# Patient Record
Sex: Female | Born: 1960 | Race: Black or African American | Hispanic: No | Smoking: Never smoker
Health system: Southern US, Community
[De-identification: ages and names within clinical notes are randomized; demographics above are authoritative.]

## PROBLEM LIST (undated history)

## (undated) DIAGNOSIS — I10 Essential (primary) hypertension: Secondary | ICD-10-CM

## (undated) DIAGNOSIS — E079 Disorder of thyroid, unspecified: Secondary | ICD-10-CM

## (undated) DIAGNOSIS — I509 Heart failure, unspecified: Secondary | ICD-10-CM

---

## 2011-05-12 DIAGNOSIS — E039 Hypothyroidism, unspecified: Secondary | ICD-10-CM | POA: Insufficient documentation

## 2014-07-12 ENCOUNTER — Emergency Department (HOSPITAL_COMMUNITY)
Admission: EM | Admit: 2014-07-12 | Discharge: 2014-07-12 | Disposition: A | Discharge status: Home / Self Care | Payer: BC Managed Care – PPO | Attending: Emergency Medicine | Admitting: Emergency Medicine

## 2014-07-12 ENCOUNTER — Encounter (HOSPITAL_COMMUNITY): Payer: Self-pay | Admitting: *Deleted

## 2014-07-12 DIAGNOSIS — R51 Headache: Secondary | ICD-10-CM

## 2014-07-12 DIAGNOSIS — J029 Acute pharyngitis, unspecified: Secondary | ICD-10-CM | POA: Diagnosis present

## 2014-07-12 DIAGNOSIS — Z8639 Personal history of other endocrine, nutritional and metabolic disease: Secondary | ICD-10-CM | POA: Insufficient documentation

## 2014-07-12 DIAGNOSIS — I10 Essential (primary) hypertension: Secondary | ICD-10-CM | POA: Insufficient documentation

## 2014-07-12 DIAGNOSIS — R519 Headache, unspecified: Secondary | ICD-10-CM

## 2014-07-12 HISTORY — DX: Essential (primary) hypertension: I10

## 2014-07-12 HISTORY — DX: Disorder of thyroid, unspecified: E07.9

## 2014-07-12 MED ORDER — AMOXICILLIN 500 MG PO CAPS: 500.0000 mg | ORAL_CAPSULE | Freq: Three times a day (TID) | ORAL | Status: DC

## 2014-07-12 NOTE — ED Notes (Addendum)
scratchy throat started yesterday. Congestion and cough also present. Pt states her grandson recently had strep throat.

## 2014-07-12 NOTE — ED Notes (Addendum)
ED PA at bedside

## 2014-07-12 NOTE — Discharge Instructions (Signed)
Take tylenol for your fever and pain. Return as needed.

## 2014-07-12 NOTE — ED Provider Notes (Signed)
CSN: [id number]     Arrival date & time 07/12/14  0813 History   First MD Initiated Contact with Patient 07/12/14 [phone]     Chief Complaint  Patient presents with  . Sore Throat     (Consider location/radiation/quality/duration/timing/severity/associated sxs/prior Treatment) Patient is a 54 y.o. female presenting with pharyngitis. The history is provided by the patient.  Sore Throat This is a new problem. The current episode started yesterday. The problem occurs constantly. The problem has been gradually worsening. Associated symptoms include chills, congestion, a sore throat and swollen glands. Cough: occasional due to post nasal drainage. Fever: ? The symptoms are aggravated by swallowing. She has tried nothing for the symptoms.   Katherine Graves is a 54 y.o. female with hx of HTN and thyroid disease presents to the ED with a sore throat. She states that her grandchild has been staying with her and he was dx with strep this week. Patient reports post nasal drainage which causes her to spit up green drainage.  Past Medical History  Diagnosis Date  . Hypertension   . Thyroid disease    History reviewed. No pertinent past surgical history. No family history on file. History  Substance Use Topics  . Smoking status: Never Smoker   . Smokeless tobacco: Not on file  . Alcohol Use: No   OB History    No data available     Review of Systems  Constitutional: Positive for chills. Fever: ?  HENT: Positive for congestion, sinus pressure and sore throat.   Respiratory: Cough: occasional due to post nasal drainage.   All other systems negative    Allergies  Review of patient's allergies indicates no known allergies.  Home Medications   Prior to Admission medications   Medication Sig Start Date End Date Taking? Authorizing Provider  amoxicillin (AMOXIL) 500 MG capsule Take 1 capsule (500 mg total) by mouth 3 (three) times daily. 07/12/14   Linn Goetze Orlene OchM Antoinne Spadaccini, NP   BP 180/89 mmHg  Pulse 97   Temp(Src) 98.9 F (37.2 C) (Oral)  Resp 20  SpO2 100% Physical Exam  Constitutional: She is oriented to person, place, and time. She appears well-developed and well-nourished. No distress.  HENT:  Head: Normocephalic.  Right Ear: Tympanic membrane normal.  Left Ear: Tympanic membrane normal.  Nose: Rhinorrhea present. Right sinus exhibits maxillary sinus tenderness. Left sinus exhibits maxillary sinus tenderness.  Mouth/Throat: Uvula is midline and mucous membranes are normal. Posterior oropharyngeal erythema present.  Eyes: EOM are normal.  Neck: Neck supple.  Cardiovascular: Normal rate and regular rhythm.   Pulmonary/Chest: Effort normal. No respiratory distress. She has no wheezes. She has no rales.  Musculoskeletal: Normal range of motion.  Lymphadenopathy:    She has cervical adenopathy.  Neurological: She is alert and oriented to person, place, and time. No cranial nerve deficit.  Skin: Skin is warm and dry.  Psychiatric: She has a normal mood and affect. Her behavior is normal.  Nursing note and vitals reviewed.   ED Course  Procedures   MDM  54 y.o. female with sore throat and exposure to strep. Stable for d/c without tonsillar abscess. No difficulty swallowing. She will follow up with her PCP or return here as needed. Discussed with the patient and all questioned fully answered.  She will follow up with her PCP for her BP.  Final diagnoses:  Pharyngitis  Sinus headache      Katherine Graves M Vivica Dobosz, NP 07/12/14 [phone]  Gerhard Munchobert Lockwood, MD 07/12/14 [phone]

## 2015-09-16 ENCOUNTER — Encounter: Payer: Self-pay | Admitting: Orthopaedic Surgery

## 2015-09-16 ENCOUNTER — Ambulatory Visit (INDEPENDENT_AMBULATORY_CARE_PROVIDER_SITE_OTHER): Payer: BC Managed Care – PPO

## 2015-09-16 ENCOUNTER — Ambulatory Visit (INDEPENDENT_AMBULATORY_CARE_PROVIDER_SITE_OTHER): Payer: BC Managed Care – PPO | Admitting: Orthopaedic Surgery

## 2015-09-16 VITALS — BP 177/84 | HR 89 | Temp 97.5°F | Resp 16 | Ht 68.0 in | Wt 258.0 lb

## 2015-09-16 DIAGNOSIS — M25561 Pain in right knee: Secondary | ICD-10-CM | POA: Diagnosis not present

## 2015-09-16 NOTE — Patient Instructions (Signed)

## 2015-09-16 NOTE — Progress Notes (Signed)
Subjective:    Patient ID: Katherine Graves, female    DOB: 28-Jul-1960, 55 y.o.   MRN: [id number]  Knee Pain  There was no injury mechanism. The pain is present in the right knee. The quality of the pain is described as aching and burning. The pain is at a severity of 5/10. The pain is moderate. The pain has been worsening since onset. Associated symptoms include a loss of motion. Pertinent negatives include no loss of sensation, muscle weakness, numbness or tingling. The symptoms are aggravated by weight bearing. She has tried ice, elevation, acetaminophen, heat and rest for the symptoms. The treatment provided mild relief.   She cannot take NSAIDs.  She has had significant stomach problems in the past on NSAIDs and cannot take them.  She has swelling and popping of the right knee, no giving way, no locking, no redness, no trauma.  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  She substitutes now often but the knee pain recently has kept her from working or tutoring.  She has had pain for about two months.  [place] [facility] [place] [facility] and referred here.  She is limping.   Review of Systems  HENT: Negative for congestion.   Respiratory: Negative for cough and shortness of breath.   Cardiovascular: Negative for chest pain and leg swelling.  Endocrine: Positive for cold intolerance.  Musculoskeletal: Positive for joint swelling, arthralgias and gait problem.  Allergic/Immunologic: Positive for environmental allergies.  Neurological: Negative for tingling and numbness.   Past Medical History  Diagnosis Date  . Hypertension   . Thyroid disease     History reviewed. No pertinent past surgical history.  No current outpatient prescriptions on file prior to visit.   No current facility-administered medications on file prior to visit.    Social History   Social History  . Marital Status: Divorced    Spouse Name: N/A  . Number of  Children: N/A  . Years of Education: N/A   Occupational History  . Not on file.   Social History Main Topics  . Smoking status: Never Smoker   . Smokeless tobacco: Not on file  . Alcohol Use: No  . Drug Use: Not on file  . Sexual Activity: Not on file   Other Topics Concern  . Not on file   Social History Narrative    BP 177/84 mmHg  Pulse 89  Temp(Src) 97.5 F (36.4 C)  Resp 16  Ht 5\' 8"  (1.727 m)  Wt 258 lb (117.028 kg)  BMI 39.24 kg/m2     Objective:   Physical Exam  Constitutional: She is oriented to person, place, and time. She appears well-developed and well-nourished.  HENT:  Head: Normocephalic and atraumatic.  Eyes: Conjunctivae and EOM are normal. Pupils are equal, round, and reactive to light.  Neck: Normal range of motion. Neck supple.  Cardiovascular: Normal rate, regular rhythm and intact distal pulses.   Pulmonary/Chest: Effort normal.  Abdominal: Soft.  Musculoskeletal: She exhibits tenderness (Right knee is tender.  ROM 0 to 105 with medial joint line pain, effusion 1+ and crepitus.  Knee is stable.  Left knee slight crepitus full motion and no pain.).       Right knee: She exhibits decreased range of motion and effusion. She exhibits no deformity and no erythema. Tenderness found. Medial joint line tenderness noted.       Legs: Neurological: She is alert and oriented  to person, place, and time. She displays normal reflexes. No cranial nerve deficit. She exhibits normal muscle tone. Coordination normal.  Skin: Skin is warm and dry.  Psychiatric: She has a normal mood and affect. Her behavior is normal. Judgment and thought content normal.   X-rays were done of the right knee, reported separately.    Assessment & Plan:   Encounter Diagnosis  Name Primary?  . Right knee pain Yes   Since she cannot take NSAIDs, I talked to her about injection of the knee.  She agrees.  PROCEDURE NOTE:  The patient requests injections of the right knee , verbal  consent was obtained.  The right knee was prepped appropriately after time out was performed.   Sterile technique was observed and injection of 1 cc of Depo-Medrol 40 mg with several cc's of plain xylocaine. Anesthesia was provided by ethyl chloride and a 20-gauge needle was used to inject the knee area. The injection was tolerated well.  A band aid dressing was applied.  The patient was advised to apply ice later today and tomorrow to the injection sight as needed.  Return in two weeks.  Consider MRI if not improved.

## 2015-10-06 ENCOUNTER — Ambulatory Visit (INDEPENDENT_AMBULATORY_CARE_PROVIDER_SITE_OTHER): Payer: BC Managed Care – PPO | Admitting: Orthopaedic Surgery

## 2015-10-06 ENCOUNTER — Encounter: Payer: Self-pay | Admitting: Orthopaedic Surgery

## 2015-10-06 VITALS — BP 181/89 | HR 77 | Temp 98.1°F | Resp 16 | Ht 68.0 in | Wt 258.0 lb

## 2015-10-06 DIAGNOSIS — M25561 Pain in right knee: Secondary | ICD-10-CM | POA: Diagnosis not present

## 2015-10-06 NOTE — Progress Notes (Signed)
Patient ID: Katherine BossierDarlene P Graves, female   DOB: 08/30/1960, 55 y.o.   MRN: [id number]  CC:  I have pain of my right knee. I would like an injection.  The patient has chronic pain of the right knee.  There is no recent trauma.  There is no redness.  Injections in the past have helped.  The knee has no redness, has an effusion and crepitus present.  ROM of the knee is 0-110.  Impression:  Chronic knee pain right.  Return: one month  PROCEDURE NOTE:  The patient requests injections of the right knee , verbal consent was obtained.  The right knee was prepped appropriately after time out was performed.   Sterile technique was observed and injection of 1 cc of Depo-Medrol 40 mg with several cc's of plain xylocaine. Anesthesia was provided by ethyl chloride and a 20-gauge needle was used to inject the knee area. The injection was tolerated well.  A band aid dressing was applied.  The patient was advised to apply ice later today and tomorrow to the injection sight as needed.

## 2015-11-03 ENCOUNTER — Encounter: Payer: Self-pay | Admitting: Orthopaedic Surgery

## 2015-11-03 ENCOUNTER — Ambulatory Visit: Payer: BC Managed Care – PPO | Admitting: Orthopaedic Surgery

## 2015-11-19 ENCOUNTER — Encounter: Payer: Self-pay | Admitting: Orthopaedic Surgery

## 2015-11-19 ENCOUNTER — Ambulatory Visit (INDEPENDENT_AMBULATORY_CARE_PROVIDER_SITE_OTHER): Payer: BC Managed Care – PPO | Admitting: Orthopaedic Surgery

## 2015-11-19 VITALS — BP 154/84 | HR 84 | Temp 97.9°F | Ht 68.0 in | Wt 257.0 lb

## 2015-11-19 DIAGNOSIS — M25561 Pain in right knee: Secondary | ICD-10-CM | POA: Diagnosis not present

## 2015-11-19 MED ORDER — TRAMADOL HCL 50 MG PO TABS: 50.0000 mg | ORAL_TABLET | Freq: Four times a day (QID) | ORAL | Status: DC | PRN

## 2015-11-19 NOTE — Progress Notes (Signed)
CC:  I have pain of my right knee. I would like an injection.  The patient has chronic pain of the right knee.  There is no recent trauma.  There is no redness.  Injections in the past have helped.  The knee has no redness, has an effusion and crepitus present.  ROM of the knee is 0-105.  Impression:  Chronic knee pain right.  Return: one month  PROCEDURE NOTE:  The patient requests injections of the right knee , verbal consent was obtained.  The right knee was prepped appropriately after time out was performed.   Sterile technique was observed and injection of 1 cc of Depo-Medrol 40 mg with several cc's of plain xylocaine. Anesthesia was provided by ethyl chloride and a 20-gauge needle was used to inject the knee area. The injection was tolerated well.  A band aid dressing was applied.  The patient was advised to apply ice later today and tomorrow to the injection sight as needed.  Electronically Signed Darreld McleanWayne Aerik Polan, MD 6/22/20172:48 PM

## 2015-12-17 ENCOUNTER — Ambulatory Visit: Payer: BC Managed Care – PPO | Admitting: Orthopaedic Surgery

## 2015-12-23 ENCOUNTER — Ambulatory Visit: Payer: BC Managed Care – PPO | Admitting: Orthopaedic Surgery

## 2016-01-07 ENCOUNTER — Ambulatory Visit (INDEPENDENT_AMBULATORY_CARE_PROVIDER_SITE_OTHER): Payer: BC Managed Care – PPO | Admitting: Orthopaedic Surgery

## 2016-01-07 ENCOUNTER — Encounter: Payer: Self-pay | Admitting: Orthopaedic Surgery

## 2016-01-07 VITALS — BP 171/99 | HR 76 | Ht 67.75 in | Wt 256.0 lb

## 2016-01-07 DIAGNOSIS — M25561 Pain in right knee: Secondary | ICD-10-CM | POA: Diagnosis not present

## 2016-01-07 NOTE — Progress Notes (Signed)
CC:  I have pain of my right knee. I would like an injection.  The patient has chronic pain of the right knee.  There is no recent trauma.  There is no redness.  Injections in the past have helped.  The knee has no redness, has an effusion and crepitus present.  ROM of the knee is 0-105.  Impression:  Chronic knee pain right.  Return: 6 weeks   PROCEDURE NOTE:  The patient requests injections of the right knee , verbal consent was obtained.  The right knee was prepped appropriately after time out was performed.   Sterile technique was observed and injection of 1 cc of Depo-Medrol 40 mg with several cc's of plain xylocaine. Anesthesia was provided by ethyl chloride and a 20-gauge needle was used to inject the knee area. The injection was tolerated well.  A band aid dressing was applied.  The patient was advised to apply ice later today and tomorrow to the injection sight as needed.   Electronically Signed Darreld McleanWayne Khambrel Amsden, MD 8/10/20172:23 PM

## 2016-02-23 ENCOUNTER — Ambulatory Visit (INDEPENDENT_AMBULATORY_CARE_PROVIDER_SITE_OTHER): Payer: BC Managed Care – PPO | Admitting: Orthopaedic Surgery

## 2016-02-23 ENCOUNTER — Encounter: Payer: Self-pay | Admitting: Orthopaedic Surgery

## 2016-02-23 VITALS — BP 134/82 | HR 80 | Ht 66.0 in | Wt 253.4 lb

## 2016-02-23 DIAGNOSIS — M25561 Pain in right knee: Secondary | ICD-10-CM | POA: Diagnosis not present

## 2016-02-23 NOTE — Progress Notes (Signed)
CC:  I have pain of my right knee. I would like an injection.  The patient has chronic pain of the right knee.  There is no recent trauma.  There is no redness.  Injections in the past have helped.  The knee has no redness, has an effusion and crepitus present.  ROM of the right knee is 0-105.  Impression:  Chronic knee pain right  Return: 6 weeks  PROCEDURE NOTE:  The patient requests injections of the right knee , verbal consent was obtained.  The right knee was prepped appropriately after time out was performed.   Sterile technique was observed and injection of 1 cc of Depo-Medrol 40 mg with several cc's of plain xylocaine. Anesthesia was provided by ethyl chloride and a 20-gauge needle was used to inject the knee area. The injection was tolerated well.  A band aid dressing was applied.  The patient was advised to apply ice later today and tomorrow to the injection sight as needed.  Electronically Signed Darreld McleanWayne Topacio Cella, MD 9/26/201710:58 AM

## 2016-04-07 ENCOUNTER — Encounter: Payer: Self-pay | Admitting: Orthopaedic Surgery

## 2016-04-12 ENCOUNTER — Ambulatory Visit: Payer: BC Managed Care – PPO | Admitting: Orthopaedic Surgery

## 2017-09-16 ENCOUNTER — Encounter (HOSPITAL_COMMUNITY): Payer: Self-pay | Admitting: Emergency Medicine

## 2017-09-16 ENCOUNTER — Other Ambulatory Visit: Payer: Self-pay

## 2017-09-16 ENCOUNTER — Emergency Department (HOSPITAL_COMMUNITY)
Admission: EM | Admit: 2017-09-16 | Discharge: 2017-09-16 | Disposition: A | Discharge status: Home / Self Care | Payer: BC Managed Care – PPO | Attending: Emergency Medicine | Admitting: Emergency Medicine

## 2017-09-16 DIAGNOSIS — I10 Essential (primary) hypertension: Secondary | ICD-10-CM | POA: Insufficient documentation

## 2017-09-16 DIAGNOSIS — J0101 Acute recurrent maxillary sinusitis: Secondary | ICD-10-CM

## 2017-09-16 DIAGNOSIS — Z79899 Other long term (current) drug therapy: Secondary | ICD-10-CM | POA: Insufficient documentation

## 2017-09-16 DIAGNOSIS — R0981 Nasal congestion: Secondary | ICD-10-CM | POA: Diagnosis present

## 2017-09-16 MED ORDER — AZITHROMYCIN 250 MG PO TABS: 250.0000 mg | ORAL_TABLET | Freq: Every day | ORAL | 0 refills | Status: DC

## 2017-09-16 MED ORDER — AZITHROMYCIN 250 MG PO TABS
500.0000 mg | ORAL_TABLET | Freq: Once | ORAL | Status: AC
  Administered 2017-09-16: 500 mg via ORAL
  Filled 2017-09-16: qty 2

## 2017-09-16 MED ORDER — HYDROCODONE-ACETAMINOPHEN 5-325 MG PO TABS: 1.0000 | ORAL_TABLET | ORAL | 0 refills | Status: DC | PRN

## 2017-09-16 NOTE — ED Triage Notes (Signed)
Pt states she has had sinus pressure and cough that started last week.

## 2017-09-16 NOTE — ED Provider Notes (Signed)
  Physical Exam  BP (!) 173/103 (BP Location: Left Arm)   Pulse 72   Temp 97.7 F (36.5 C) (Oral)   Resp 20   Wt 114.8 kg (253 lb)   SpO2 96%   BMI 40.84 kg/m   Physical Exam  ED Course/Procedures     Procedures  MDM  Patient came back to the ER.  Vicodin prescription was not signed.  I wrote out a new prescription for her.       Benjiman CorePickering, Tjay Velazquez, MD 09/16/17 1024

## 2017-09-16 NOTE — ED Provider Notes (Signed)
[facility] EMERGENCY DEPARTMENT Provider Note   CSN: [id number] Arrival date & time: 09/16/17  [id number]     History   Chief Complaint Chief Complaint  Patient presents with  . Nasal Congestion    HPI Katherine Graves is a 57 y.o. female.  Patient presents to the ER for evaluation of sinus pain.  Patient reports that approximately 1 week ago she mowed the yard and then started having sinus congestion.  She normally has sinus problems, uses Flonase and Singulair regularly.  She also, however, has a history of recurrent sinus infections.  She has had thick nasal drainage with some nonproductive cough.  She has had progressive worsening of her sinus pressure and pain, could not sleep tonight because of the pain.  She has a history of infections with similar symptoms.  She also has hypertension, did not take her blood pressure medication tonight and has been taking over-the-counter medications with decongestant.     Past Medical History:  Diagnosis Date  . Hypertension   . Thyroid disease     There are no active problems to display for this patient.   History reviewed. No pertinent surgical history.   OB History   None      Home Medications    Prior to Admission medications   Medication Sig Start Date End Date Taking? Authorizing Provider  allopurinol (ZYLOPRIM) 100 MG tablet Take 100 mg by mouth daily.    [provider]  amLODipine-benazepril (LOTREL) 5-40 MG capsule Take 1 capsule by mouth daily.    [provider]  azithromycin (ZITHROMAX) 250 MG tablet Take 1 tablet (250 mg total) by mouth daily. Take first 2 tablets together, then 1 every day until finished. 09/16/17   Gilda CreasePollina, Benedetto Ryder J, MD  cetirizine (ZYRTEC) 10 MG tablet Take 10 mg by mouth daily.    [provider]  fluticasone (FLONASE) 50 MCG/ACT nasal spray Place into both nostrils daily.    [provider]  hydrALAZINE (APRESOLINE) 50 MG tablet Take 50 mg by mouth 3 (three)  times daily.    [provider]  HYDROcodone-acetaminophen (NORCO/VICODIN) 5-325 MG tablet Take 1-2 tablets by mouth every 4 (four) hours as needed. 09/16/17   Gilda CreasePollina, Leib Elahi J, MD  levothyroxine (SYNTHROID, LEVOTHROID) 125 MCG tablet Take 125 mcg by mouth daily before breakfast.    [provider]  traMADol (ULTRAM) 50 MG tablet Take 1 tablet (50 mg total) by mouth every 6 (six) hours as needed. 11/19/15   Darreld McleanKeeling, Wayne, MD    Family History No family history on file.  Social History Social History   Tobacco Use  . Smoking status: Never Smoker  . Smokeless tobacco: Never Used  Substance Use Topics  . Alcohol use: No  . Drug use: Not on file     Allergies   Patient has no known allergies.   Review of Systems Review of Systems  HENT: Positive for congestion, sinus pressure and sinus pain.   Respiratory: Positive for cough.   All other systems reviewed and are negative.    Physical Exam Updated Vital Signs BP (!) 173/103 (BP Location: Left Arm)   Pulse 72   Temp 97.7 F (36.5 C) (Oral)   Resp 20   Wt 114.8 kg (253 lb)   SpO2 96%   BMI 40.84 kg/m   Physical Exam  Constitutional: She is oriented to person, place, and time. She appears well-developed and well-nourished. No distress.  HENT:  Head: Normocephalic and atraumatic.  Right  Ear: Hearing normal.  Left Ear: Hearing normal.  Nose: Right sinus exhibits maxillary sinus tenderness. Left sinus exhibits maxillary sinus tenderness.  Mouth/Throat: Oropharynx is clear and moist and mucous membranes are normal.  Eyes: Pupils are equal, round, and reactive to light. Conjunctivae and EOM are normal.  Neck: Normal range of motion. Neck supple.  Cardiovascular: Regular rhythm, S1 normal and S2 normal. Exam reveals no gallop and no friction rub.  No murmur heard. Pulmonary/Chest: Effort normal and breath sounds normal. No respiratory distress. She exhibits no tenderness.  Abdominal: Soft. Normal  appearance and bowel sounds are normal. There is no hepatosplenomegaly. There is no tenderness. There is no rebound, no guarding, no tenderness at McBurney's point and negative Murphy's sign. No hernia.  Musculoskeletal: Normal range of motion.  Neurological: She is alert and oriented to person, place, and time. She has normal strength. No cranial nerve deficit or sensory deficit. Coordination normal. GCS eye subscore is 4. GCS verbal subscore is 5. GCS motor subscore is 6.  Skin: Skin is warm, dry and intact. No rash noted. No cyanosis.  Psychiatric: She has a normal mood and affect. Her speech is normal and behavior is normal. Thought content normal.  Nursing note and vitals reviewed.    ED Treatments / Results  Labs (all labs ordered are listed, but only abnormal results are displayed) Labs Reviewed - No data to display  EKG None  Radiology No results found.  Procedures Procedures (including critical care time)  Medications Ordered in ED Medications  azithromycin (ZITHROMAX) tablet 500 mg (has no administration in time range)     Initial Impression / Assessment and Plan / ED Course  I have reviewed the triage vital signs and the nursing notes.  Pertinent labs & imaging results that were available during my care of the patient were reviewed by me and considered in my medical decision making (see chart for details).     She with history of recurrent sinus infection presents with 1+ week of progressively worsening sinus pain and congestion.  Appears well, no hypoxia.  Blood pressure is elevated but this is multifactorial including having not taken her blood pressure medication last night and taking over-the-counter decongestants.  She was told to stop over-the-counter meds and will be started on Zithromax, continue Singulair and Flonase.  Final Clinical Impressions(s) / ED Diagnoses   Final diagnoses:  Acute recurrent maxillary sinusitis    ED Discharge Orders         Ordered    azithromycin (ZITHROMAX) 250 MG tablet  Daily     09/16/17 0342    HYDROcodone-acetaminophen (NORCO/VICODIN) 5-325 MG tablet  Every 4 hours PRN     09/16/17 0342       Gilda Crease, MD 09/16/17 [phone]

## 2017-09-16 NOTE — ED Notes (Signed)
Pt returned with unsigned Vicodin prescription that was discharged on previous shift. Current EDP aware and provided patient with hand written prescription for Vicodin. Printed prescription that was provided with discharge instructions was placed in shredder.

## 2019-02-26 ENCOUNTER — Telehealth: Payer: Self-pay | Admitting: Orthopaedic Surgery

## 2019-02-26 NOTE — Telephone Encounter (Signed)
Called back to patient regarding voice message; scheduled appointment as requested; aware of appointment.

## 2019-03-05 ENCOUNTER — Other Ambulatory Visit: Payer: Self-pay

## 2019-03-05 ENCOUNTER — Ambulatory Visit (INDEPENDENT_AMBULATORY_CARE_PROVIDER_SITE_OTHER): Payer: BC Managed Care – PPO | Admitting: Orthopaedic Surgery

## 2019-03-05 ENCOUNTER — Encounter: Payer: Self-pay | Admitting: Orthopaedic Surgery

## 2019-03-05 ENCOUNTER — Ambulatory Visit: Payer: BC Managed Care – PPO

## 2019-03-05 VITALS — BP 152/77 | HR 66 | Ht 68.0 in | Wt 272.6 lb

## 2019-03-05 DIAGNOSIS — Z6841 Body Mass Index (BMI) 40.0 and over, adult: Secondary | ICD-10-CM | POA: Diagnosis not present

## 2019-03-05 DIAGNOSIS — M25562 Pain in left knee: Secondary | ICD-10-CM | POA: Diagnosis not present

## 2019-03-05 NOTE — Progress Notes (Signed)
Subjective:    Patient ID: Katherine Graves, female    DOB: 03-May-1961, 58 y.o.   MRN: [id number]  HPI She developed acute knee pain about two weeks ago of the left knee. She has swelling and popping but no giving way. She has no redness, no trauma.  She is using a cane. It has not gotten better.  She had injection to the right knee over three years ago and it did well.   Review of Systems  Constitutional: Positive for activity change.  Musculoskeletal: Positive for arthralgias, gait problem and joint swelling.  All other systems reviewed and are negative.  For Review of Systems, all other systems reviewed and are negative.  The following is a summary of the past history medically, past history surgically, known current medicines, social history and family history.  This information is gathered electronically by the computer from prior information and documentation.  I review this each visit and have found including this information at this point in the chart is beneficial and informative.   Past Medical History:  Diagnosis Date  . Hypertension   . Thyroid disease     History reviewed. No pertinent surgical history.  Current Outpatient Medications on File Prior to Visit  Medication Sig Dispense Refill  . allopurinol (ZYLOPRIM) 100 MG tablet Take 100 mg by mouth daily.    Marland Kitchen amLODipine-benazepril (LOTREL) 5-40 MG capsule Take 1 capsule by mouth daily.    . hydrALAZINE (APRESOLINE) 50 MG tablet Take 50 mg by mouth 3 (three) times daily.    Marland Kitchen levothyroxine (SYNTHROID, LEVOTHROID) 125 MCG tablet Take 125 mcg by mouth daily before breakfast.    . metoprolol succinate (TOPROL-XL) 50 MG 24 hr tablet      No current facility-administered medications on file prior to visit.     Social History   Socioeconomic History  . Marital status: Divorced    Spouse name: Not on file  . Number of children: Not on file  . Years of education: Not on file  . Highest education level: Not on file   Occupational History  . Not on file  Social Needs  . Financial resource strain: Not on file  . Food insecurity    Worry: Not on file    Inability: Not on file  . Transportation needs    Medical: Not on file    Non-medical: Not on file  Tobacco Use  . Smoking status: Never Smoker  . Smokeless tobacco: Never Used  Substance and Sexual Activity  . Alcohol use: No  . Drug use: Not on file  . Sexual activity: Not on file  Lifestyle  . Physical activity    Days per week: Not on file    Minutes per session: Not on file  . Stress: Not on file  Relationships  . Social Herbalist on phone: Not on file    Gets together: Not on file    Attends religious service: Not on file    Active member of club or organization: Not on file    Attends meetings of clubs or organizations: Not on file    Relationship status: Not on file  . Intimate partner violence    Fear of current or ex partner: Not on file    Emotionally abused: Not on file    Physically abused: Not on file    Forced sexual activity: Not on file  Other Topics Concern  . Not on file  Social History Narrative  .  Not on file    History reviewed. No pertinent family history.  BP (!) 152/77   Pulse 66   Ht 5\' 8"  (1.727 m)   Wt 272 lb 9.6 oz (123.7 kg)   BMI 41.45 kg/m   Body mass index is 41.45 kg/m.     Objective:   Physical Exam Vitals signs reviewed.  Constitutional:      Appearance: She is well-developed.  HENT:     Head: Normocephalic and atraumatic.  Eyes:     Conjunctiva/sclera: Conjunctivae normal.     Pupils: Pupils are equal, round, and reactive to light.  Neck:     Musculoskeletal: Normal range of motion and neck supple.  Cardiovascular:     Rate and Rhythm: Normal rate and regular rhythm.  Pulmonary:     Effort: Pulmonary effort is normal.  Abdominal:     Palpations: Abdomen is soft.  Musculoskeletal:     Left knee: She exhibits swelling and effusion. Tenderness found. Medial joint  line tenderness noted.       Legs:  Skin:    General: Skin is warm and dry.  Neurological:     Mental Status: She is alert and oriented to person, place, and time.     Cranial Nerves: No cranial nerve deficit.     Motor: No abnormal muscle tone.     Coordination: Coordination normal.     Deep Tendon Reflexes: Reflexes are normal and symmetric. Reflexes normal.  Psychiatric:        Behavior: Behavior normal.        Thought Content: Thought content normal.        Judgment: Judgment normal.      X-rays were done of the left knee, reported separately.     Assessment & Plan:   Encounter Diagnoses  Name Primary?  . Acute pain of left knee Yes  . Body mass index 40.0-44.9, adult (HCC)   . Morbid obesity (HCC)    PROCEDURE NOTE:  The patient requests injections of the left knee , verbal consent was obtained.  The left knee was prepped appropriately after time out was performed.   Sterile technique was observed and injection of 1 cc of Depo-Medrol 40 mg with several cc's of plain xylocaine. Anesthesia was provided by ethyl chloride and a 20-gauge needle was used to inject the knee area. The injection was tolerated well.  A band aid dressing was applied.  The patient was advised to apply ice later today and tomorrow to the injection sight as needed.  Return in two weeks.  Call if any problem.  Precautions discussed.   Electronically Signed 03-12-1982, MD 10/6/202010:59 AM

## 2019-03-05 NOTE — Patient Instructions (Signed)
Note for out of work today

## 2019-03-19 ENCOUNTER — Ambulatory Visit: Payer: BC Managed Care – PPO | Admitting: Orthopaedic Surgery

## 2019-03-19 ENCOUNTER — Encounter: Payer: Self-pay | Admitting: Orthopaedic Surgery

## 2019-03-19 ENCOUNTER — Other Ambulatory Visit: Payer: Self-pay

## 2019-03-19 VITALS — BP 168/79 | HR 65 | Ht 68.0 in | Wt 272.0 lb

## 2019-03-19 DIAGNOSIS — M25562 Pain in left knee: Secondary | ICD-10-CM

## 2019-03-19 DIAGNOSIS — G8929 Other chronic pain: Secondary | ICD-10-CM | POA: Diagnosis not present

## 2019-03-19 MED ORDER — NAPROXEN 500 MG PO TABS: 500.0000 mg | ORAL_TABLET | Freq: Two times a day (BID) | ORAL | 5 refills | Status: DC

## 2019-03-19 NOTE — Progress Notes (Signed)
PROCEDURE NOTE:  The patient requests injections of the left knee , verbal consent was obtained.  The left knee was prepped appropriately after time out was performed.   Sterile technique was observed and injection of 1 cc of Depo-Medrol 40 mg with several cc's of plain xylocaine. Anesthesia was provided by ethyl chloride and a 20-gauge needle was used to inject the knee area. The injection was tolerated well.  A band aid dressing was applied.  The patient was advised to apply ice later today and tomorrow to the injection sight as needed.  Return in one month.  If worse, consider MRI.  Electronically Signed Sanjuana Kava, MD 10/20/202010:34 AM

## 2019-04-18 ENCOUNTER — Ambulatory Visit: Payer: BC Managed Care – PPO | Admitting: Orthopaedic Surgery

## 2019-04-18 ENCOUNTER — Encounter: Payer: Self-pay | Admitting: Orthopaedic Surgery

## 2021-04-16 ENCOUNTER — Inpatient Hospital Stay (HOSPITAL_COMMUNITY)
Admission: EM | Admit: 2021-04-16 | Discharge: 2021-04-21 | DRG: 207 | Disposition: A | Discharge status: Home / Self Care | Payer: BC Managed Care – PPO | Attending: Family Medicine | Admitting: Family Medicine

## 2021-04-16 ENCOUNTER — Emergency Department (HOSPITAL_COMMUNITY): Payer: BC Managed Care – PPO

## 2021-04-16 ENCOUNTER — Encounter (HOSPITAL_COMMUNITY): Payer: Self-pay | Admitting: Emergency Medicine

## 2021-04-16 ENCOUNTER — Other Ambulatory Visit: Payer: Self-pay

## 2021-04-16 DIAGNOSIS — J9621 Acute and chronic respiratory failure with hypoxia: Secondary | ICD-10-CM | POA: Diagnosis present

## 2021-04-16 DIAGNOSIS — I248 Other forms of acute ischemic heart disease: Secondary | ICD-10-CM | POA: Diagnosis present

## 2021-04-16 DIAGNOSIS — J1000 Influenza due to other identified influenza virus with unspecified type of pneumonia: Principal | ICD-10-CM | POA: Diagnosis present

## 2021-04-16 DIAGNOSIS — Z79899 Other long term (current) drug therapy: Secondary | ICD-10-CM

## 2021-04-16 DIAGNOSIS — Z20822 Contact with and (suspected) exposure to covid-19: Secondary | ICD-10-CM | POA: Diagnosis present

## 2021-04-16 DIAGNOSIS — J9601 Acute respiratory failure with hypoxia: Secondary | ICD-10-CM | POA: Diagnosis present

## 2021-04-16 DIAGNOSIS — Z791 Long term (current) use of non-steroidal anti-inflammatories (NSAID): Secondary | ICD-10-CM

## 2021-04-16 DIAGNOSIS — E039 Hypothyroidism, unspecified: Secondary | ICD-10-CM | POA: Diagnosis present

## 2021-04-16 DIAGNOSIS — M109 Gout, unspecified: Secondary | ICD-10-CM | POA: Diagnosis present

## 2021-04-16 DIAGNOSIS — I509 Heart failure, unspecified: Secondary | ICD-10-CM

## 2021-04-16 DIAGNOSIS — Z79890 Hormone replacement therapy (postmenopausal): Secondary | ICD-10-CM

## 2021-04-16 DIAGNOSIS — I130 Hypertensive heart and chronic kidney disease with heart failure and stage 1 through stage 4 chronic kidney disease, or unspecified chronic kidney disease: Secondary | ICD-10-CM | POA: Diagnosis present

## 2021-04-16 DIAGNOSIS — G4733 Obstructive sleep apnea (adult) (pediatric): Secondary | ICD-10-CM | POA: Diagnosis present

## 2021-04-16 DIAGNOSIS — N179 Acute kidney failure, unspecified: Secondary | ICD-10-CM | POA: Diagnosis present

## 2021-04-16 DIAGNOSIS — E876 Hypokalemia: Secondary | ICD-10-CM | POA: Diagnosis present

## 2021-04-16 DIAGNOSIS — J101 Influenza due to other identified influenza virus with other respiratory manifestations: Secondary | ICD-10-CM

## 2021-04-16 DIAGNOSIS — I272 Pulmonary hypertension, unspecified: Secondary | ICD-10-CM | POA: Diagnosis present

## 2021-04-16 DIAGNOSIS — J9622 Acute and chronic respiratory failure with hypercapnia: Secondary | ICD-10-CM | POA: Diagnosis present

## 2021-04-16 DIAGNOSIS — N1831 Chronic kidney disease, stage 3a: Secondary | ICD-10-CM | POA: Diagnosis present

## 2021-04-16 DIAGNOSIS — Z6839 Body mass index (BMI) 39.0-39.9, adult: Secondary | ICD-10-CM

## 2021-04-16 DIAGNOSIS — J45909 Unspecified asthma, uncomplicated: Secondary | ICD-10-CM | POA: Diagnosis present

## 2021-04-16 DIAGNOSIS — J9602 Acute respiratory failure with hypercapnia: Secondary | ICD-10-CM | POA: Diagnosis present

## 2021-04-16 DIAGNOSIS — R0602 Shortness of breath: Secondary | ICD-10-CM | POA: Diagnosis not present

## 2021-04-16 DIAGNOSIS — I5033 Acute on chronic diastolic (congestive) heart failure: Secondary | ICD-10-CM | POA: Diagnosis present

## 2021-04-16 DIAGNOSIS — I10 Essential (primary) hypertension: Secondary | ICD-10-CM

## 2021-04-16 HISTORY — DX: Heart failure, unspecified: I50.9

## 2021-04-16 LAB — CBC WITH DIFFERENTIAL/PLATELET
Abs Immature Granulocytes: 0.04 10*3/uL (ref 0.00–0.07)
Basophils Absolute: 0 10*3/uL (ref 0.0–0.1)
Basophils Relative: 0 %
Eosinophils Absolute: 0 10*3/uL (ref 0.0–0.5)
Eosinophils Relative: 0 %
HCT: 35.3 % — ABNORMAL LOW (ref 36.0–46.0)
Hemoglobin: 10.3 g/dL — ABNORMAL LOW (ref 12.0–15.0)
Immature Granulocytes: 1 %
Lymphocytes Relative: 18 %
Lymphs Abs: 1.3 10*3/uL (ref 0.7–4.0)
MCH: 26.2 pg (ref 26.0–34.0)
MCHC: 29.2 g/dL — ABNORMAL LOW (ref 30.0–36.0)
MCV: 89.8 fL (ref 80.0–100.0)
Monocytes Absolute: 0.3 10*3/uL (ref 0.1–1.0)
Monocytes Relative: 4 %
Neutro Abs: 5.7 10*3/uL (ref 1.7–7.7)
Neutrophils Relative %: 77 %
Platelets: 266 10*3/uL (ref 150–400)
RBC: 3.93 MIL/uL (ref 3.87–5.11)
RDW: 17.5 % — ABNORMAL HIGH (ref 11.5–15.5)
WBC: 7.3 10*3/uL (ref 4.0–10.5)
nRBC: 3.7 % — ABNORMAL HIGH (ref 0.0–0.2)

## 2021-04-16 LAB — BLOOD GAS, ARTERIAL
Acid-base deficit: 2.8 mmol/L — ABNORMAL HIGH (ref 0.0–2.0)
Bicarbonate: 21.8 mmol/L (ref 20.0–28.0)
Drawn by: 22223
FIO2: 100
O2 Saturation: 99.5 %
Patient temperature: 37
pCO2 arterial: 43.8 mmHg (ref 32.0–48.0)
pH, Arterial: 7.326 — ABNORMAL LOW (ref 7.350–7.450)
pO2, Arterial: 292 mmHg — ABNORMAL HIGH (ref 83.0–108.0)

## 2021-04-16 LAB — BASIC METABOLIC PANEL
Anion gap: 14 (ref 5–15)
BUN: 38 mg/dL — ABNORMAL HIGH (ref 8–23)
CO2: 26 mmol/L (ref 22–32)
Calcium: 8.1 mg/dL — ABNORMAL LOW (ref 8.9–10.3)
Chloride: 98 mmol/L (ref 98–111)
Creatinine, Ser: 3.03 mg/dL — ABNORMAL HIGH (ref 0.44–1.00)
GFR, Estimated: 17 mL/min — ABNORMAL LOW (ref >60)
Glucose, Bld: 116 mg/dL — ABNORMAL HIGH (ref 70–99)
Potassium: 3.8 mmol/L (ref 3.5–5.1)
Sodium: 138 mmol/L (ref 135–145)

## 2021-04-16 LAB — LACTIC ACID, PLASMA: Lactic Acid, Venous: 2.6 mmol/L (ref 0.5–1.9)

## 2021-04-16 LAB — RESP PANEL BY RT-PCR (FLU A&B, COVID) ARPGX2
Influenza A by PCR: POSITIVE — AB
Influenza B by PCR: NEGATIVE
SARS Coronavirus 2 by RT PCR: NEGATIVE

## 2021-04-16 LAB — BRAIN NATRIURETIC PEPTIDE: B Natriuretic Peptide: 1519 pg/mL — ABNORMAL HIGH (ref 0.0–100.0)

## 2021-04-16 LAB — TROPONIN I (HIGH SENSITIVITY): Troponin I (High Sensitivity): 983 ng/L (ref <18)

## 2021-04-16 MED ORDER — OSELTAMIVIR PHOSPHATE 30 MG PO CAPS
30.0000 mg | ORAL_CAPSULE | Freq: Once | ORAL | Status: AC
  Administered 2021-04-17: 30 mg via ORAL
  Filled 2021-04-16: qty 1

## 2021-04-16 MED ORDER — METHYLPREDNISOLONE SODIUM SUCC 125 MG IJ SOLR
125.0000 mg | Freq: Once | INTRAMUSCULAR | Status: AC
  Administered 2021-04-16: 125 mg via INTRAVENOUS
  Filled 2021-04-16: qty 2

## 2021-04-16 MED ORDER — FUROSEMIDE 10 MG/ML IJ SOLN
40.0000 mg | Freq: Once | INTRAMUSCULAR | Status: AC
  Administered 2021-04-16: 40 mg via INTRAVENOUS
  Filled 2021-04-16: qty 4

## 2021-04-16 MED ORDER — ALBUTEROL SULFATE (2.5 MG/3ML) 0.083% IN NEBU
INHALATION_SOLUTION | RESPIRATORY_TRACT | Status: AC
  Filled 2021-04-16: qty 12

## 2021-04-16 MED ORDER — ALBUTEROL SULFATE HFA 108 (90 BASE) MCG/ACT IN AERS
2.0000 | INHALATION_SPRAY | RESPIRATORY_TRACT | Status: DC | PRN
  Filled 2021-04-16: qty 6.7

## 2021-04-16 MED ORDER — IPRATROPIUM BROMIDE 0.02 % IN SOLN
0.5000 mg | Freq: Once | RESPIRATORY_TRACT | Status: AC
  Administered 2021-04-16: 0.5 mg via RESPIRATORY_TRACT

## 2021-04-16 MED ORDER — MAGNESIUM SULFATE 2 GM/50ML IV SOLN
2.0000 g | Freq: Once | INTRAVENOUS | Status: AC
  Administered 2021-04-16: 2 g via INTRAVENOUS
  Filled 2021-04-16: qty 50

## 2021-04-16 MED ORDER — ASPIRIN 81 MG PO CHEW
324.0000 mg | CHEWABLE_TABLET | Freq: Once | ORAL | Status: AC
  Administered 2021-04-17: 324 mg via ORAL
  Filled 2021-04-16: qty 4

## 2021-04-16 MED ORDER — ALBUTEROL (5 MG/ML) CONTINUOUS INHALATION SOLN
10.0000 mg/h | INHALATION_SOLUTION | Freq: Once | RESPIRATORY_TRACT | Status: DC
  Filled 2021-04-16: qty 20

## 2021-04-16 MED ORDER — IPRATROPIUM BROMIDE 0.02 % IN SOLN
RESPIRATORY_TRACT | Status: AC
  Filled 2021-04-16: qty 2.5

## 2021-04-16 NOTE — ED Provider Notes (Signed)
[facility] Provider Note   CSN: [id number] Arrival date & time: 04/16/21  2208     History Chief Complaint  Patient presents with   Respiratory Distress    Katherine Graves is a 60 y.o. female.  Level 5 caveat for respiratory distress.  Patient arrives via EMS with respiratory distress and hypoxia.  She has had shortness of breath for the past several days.  EMS reports her O2 sats were in the 50% on EMS arrival and she was in distress.  She was placed on oxygen and given 2 breathing treatments.  She states has a history of asthma as well as CHF.  Uses albuterol on a as needed basis.  Does not know what medication she takes.  Her cough is nonproductive.  She denies any chest pain.  She denies any fever.  She denies any abdominal pain, leg pain or leg swelling.  She has never been admitted to the hospital for her breathing.  She denies any fevers.  Denies any pain with urination or blood in the urine. She does not know what medication she takes at home.  States only uses albuterol on an as-needed basis  The history is provided by the patient and the EMS personnel. The history is limited by the condition of the patient.      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 No pertinent surgical history.   OB History   No obstetric history on file.     History reviewed. No pertinent family history.  Social History   Tobacco Use   Smoking status: Never   Smokeless tobacco: Never  Substance Use Topics   Alcohol use: No   Drug use: Never    Home Medications Prior to Admission medications   Medication Sig Start Date End Date Taking? Authorizing Provider  allopurinol (ZYLOPRIM) 100 MG tablet Take 100 mg by mouth daily.    [provider]  amLODipine-benazepril (LOTREL) 5-40 MG  capsule Take 1 capsule by mouth daily.    [provider]  hydrALAZINE (APRESOLINE) 50 MG tablet Take 50 mg by mouth 3 (three) times daily.    [provider]  levothyroxine (SYNTHROID, LEVOTHROID) 125 MCG tablet Take 125 mcg by mouth daily before breakfast.    [provider]  metoprolol succinate (TOPROL-XL) 50 MG 24 hr tablet  02/07/19   [provider]  naproxen (NAPROSYN) 500 MG tablet Take 1 tablet (500 mg total) by mouth 2 (two) times daily with a meal. 03/19/19   Sanjuana Kava, MD    Allergies    Patient has no known allergies.  Review of Systems   Review of Systems  Constitutional:  Negative for activity change, appetite change and fever.  HENT:  Negative for congestion and rhinorrhea.   Respiratory:  Positive for cough, chest tightness and shortness of breath.   Cardiovascular:  Negative for chest pain and leg swelling.  Gastrointestinal:  Negative for abdominal pain, nausea and vomiting.  Genitourinary:  Negative for dysuria and hematuria.  Musculoskeletal:  Negative for arthralgias and myalgias.  Skin:  Negative for rash.  Neurological:  Negative for dizziness, weakness and headaches.   all other systems are negative except as noted in the HPI and PMH.   Physical Exam Updated Vital Signs BP  111/68   Pulse 80   Temp 97.9 F (36.6 C) (Oral)   Resp 15   Ht 5\' 8"  (1.727 m)   Wt 125 kg   SpO2 100%   BMI 41.90 kg/m   Physical Exam Vitals and nursing note reviewed.  Constitutional:      General: She is in acute distress.     Appearance: She is well-developed. She is ill-appearing.     Comments: Moderate respiratory distress, speaking in short phrases  HENT:     Head: Normocephalic and atraumatic.     Mouth/Throat:     Pharynx: No oropharyngeal exudate.  Eyes:     Conjunctiva/sclera: Conjunctivae normal.     Pupils: Pupils are equal, round, and reactive to light.     Comments: Proptosis bilaterally  Neck:     Comments: No  meningismus. Cardiovascular:     Rate and Rhythm: Normal rate and regular rhythm.     Heart sounds: Normal heart sounds. No murmur heard. Pulmonary:     Effort: Respiratory distress present.     Breath sounds: Wheezing present.     Comments: Diminished air exchange bilaterally with tight expiratory wheezes Abdominal:     Palpations: Abdomen is soft.     Tenderness: There is no abdominal tenderness. There is no guarding or rebound.  Musculoskeletal:        General: No tenderness. Normal range of motion.     Cervical back: Normal range of motion and neck supple.     Right lower leg: No edema.     Left lower leg: No edema.  Skin:    General: Skin is warm.  Neurological:     Mental Status: She is alert and oriented to person, place, and time.     Cranial Nerves: No cranial nerve deficit.     Motor: No abnormal muscle tone.     Coordination: Coordination normal.     Comments:  5/5 strength throughout. CN 2-12 intact.Equal grip strength.   Psychiatric:        Behavior: Behavior normal.    ED Results / Procedures / Treatments   Labs (all labs ordered are listed, but only abnormal results are displayed) Labs Reviewed  RESP PANEL BY RT-PCR (FLU A&B, COVID) ARPGX2 - Abnormal; Notable for the following components:      Result Value   Influenza A by PCR POSITIVE (*)    All other components within normal limits  BRAIN NATRIURETIC PEPTIDE - Abnormal; Notable for the following components:   B Natriuretic Peptide 1,519.0 (*)    All other components within normal limits  CBC WITH DIFFERENTIAL/PLATELET - Abnormal; Notable for the following components:   Hemoglobin 10.3 (*)    HCT 35.3 (*)    MCHC 29.2 (*)    RDW 17.5 (*)    nRBC 3.7 (*)    All other components within normal limits  BASIC METABOLIC PANEL - Abnormal; Notable for the following components:   Glucose, Bld 116 (*)    BUN 38 (*)    Creatinine, Ser 3.03 (*)    Calcium 8.1 (*)    GFR, Estimated 17 (*)    All other components  within normal limits  BLOOD GAS, ARTERIAL - Abnormal; Notable for the following components:   pH, Arterial 7.326 (*)    pO2, Arterial 292 (*)    Acid-base deficit 2.8 (*)    All other components within normal limits  TSH - Abnormal; Notable for the following components:   TSH 6.864 (*)  All other components within normal limits  LACTIC ACID, PLASMA - Abnormal; Notable for the following components:   Lactic Acid, Venous 2.6 (*)    All other components within normal limits  LACTIC ACID, PLASMA - Abnormal; Notable for the following components:   Lactic Acid, Venous 2.4 (*)    All other components within normal limits  BLOOD GAS, ARTERIAL - Abnormal; Notable for the following components:   pH, Arterial 7.288 (*)    pCO2 arterial 61.7 (*)    pO2, Arterial 76.5 (*)    Acid-Base Excess 2.6 (*)    All other components within normal limits  BLOOD GAS, ARTERIAL - Abnormal; Notable for the following components:   pH, Arterial 7.302 (*)    pCO2 arterial 56.7 (*)    pO2, Arterial 110 (*)    All other components within normal limits  TROPONIN I (HIGH SENSITIVITY) - Abnormal; Notable for the following components:   Troponin I (High Sensitivity) 983 (*)    All other components within normal limits  TROPONIN I (HIGH SENSITIVITY) - Abnormal; Notable for the following components:   Troponin I (High Sensitivity) 1,407 (*)    All other components within normal limits  CULTURE, BLOOD (ROUTINE X 2)  CULTURE, BLOOD (ROUTINE X 2)  AMMONIA  HEPARIN LEVEL (UNFRACTIONATED)    EKG EKG Interpretation  Date/Time:  Friday April 16 2021 22:28:02 EST Ventricular Rate:  79 PR Interval:  129 QRS Duration: 74 QT Interval:  413 QTC Calculation: 474 R Axis:   110 Text Interpretation: Sinus rhythm Anterior infarct, old No previous ECGs available Confirmed by Ezequiel Essex [phone]) on 04/16/2021 11:28:53 PM  Radiology CT Head Wo Contrast  Result Date: 04/17/2021 CLINICAL DATA:  Delirium EXAM: CT  HEAD WITHOUT CONTRAST TECHNIQUE: Contiguous axial images were obtained from the base of the skull through the vertex without intravenous contrast. COMPARISON:  None. FINDINGS: Brain: Normal anatomic configuration. Parenchymal volume loss is commensurate with the patient's age. Moderate periventricular white matter changes are present likely reflecting the sequela of small vessel ischemia. No abnormal intra or extra-axial mass lesion or fluid collection. No abnormal mass effect or midline shift. No evidence of acute intracranial hemorrhage or infarct. Ventricular size is normal. Cerebellum unremarkable. Vascular: No asymmetric hyperdense vasculature at the skull base. Skull: Intact Sinuses/Orbits: There is bilateral exophthalmos with hypertrophy extraocular musculature as well as the retro-orbital fat in keeping with Graves ophthalmopathy. The paranasal sinuses are clear. Other: Mastoid air cells and middle ear cavities are clear. 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 Correlation with thyroid function tests is recommended. Moderate periventricular white matter changes likely reflecting the sequela of small vessel ischemia, slightly advanced in relation of the patient's age. No acute intracranial abnormality. Electronically Signed   By: Fidela Salisbury M.D.   On: 04/17/2021 03:50   DG Chest Portable 1 View  Result Date: 04/17/2021 CLINICAL DATA:  Intubation EXAM: PORTABLE CHEST 1 VIEW COMPARISON:  04/16/2021 FINDINGS: Support Apparatus: --Endotracheal tube: Tip just below the level of the clavicular heads. --Enteric tube:Tip and sideport project over the stomach. --Vascular catheter(s):None --Other: None The heart size and mediastinal contours are within normal limits. The lungs are clear. No pleural effusion or pneumothorax. IMPRESSION: No active disease. Electronically Signed   By: Ulyses Jarred M.D.   On:  04/17/2021 02:24   DG Chest Portable 1 View  Result Date: 04/16/2021 CLINICAL DATA:  Dyspnea EXAM: PORTABLE CHEST 1 VIEW COMPARISON:  None FINDINGS: The lungs are symmetrically expanded. There are mild bilateral perihilar interstitial pulmonary infiltrates which may reflect changes of airway inflammation or trace perihilar pulmonary edema. No pneumothorax or pleural effusion. Mild cardiomegaly. No acute bone abnormality. IMPRESSION: Mild bilateral perihilar interstitial pulmonary infiltrate possibly reflecting changes of airway inflammation, as can be seen with atypical infection, or trace perihilar pulmonary edema, possibly cardiogenic in nature. Mild cardiomegaly Electronically Signed   By: Fidela Salisbury M.D.   On: 04/16/2021 23:01    Procedures Procedure Name: Intubation Date/Time: 04/17/2021 1:56 AM Performed by: Ezequiel Essex, MD Pre-anesthesia Checklist: Patient identified, Patient being monitored, Emergency Drugs available, Timeout performed and Suction available Oxygen Delivery Method: Non-rebreather mask Preoxygenation: Pre-oxygenation with 100% oxygen Induction Type: Rapid sequence Ventilation: Mask ventilation without difficulty Laryngoscope Size: Glidescope and 3 Grade View: Grade I Tube size: 7.5 mm Number of attempts: 1 Airway Equipment and Method: Video-laryngoscopy and Stylet Placement Confirmation: ETT inserted through vocal cords under direct vision, CO2 detector and Breath sounds checked- equal and bilateral Secured at: 25 cm Tube secured with: ETT holder Dental Injury: Teeth and Oropharynx as per pre-operative assessment  Difficulty Due To: Difficult Airway- due to large tongue and Difficult Airway- due to limited oral opening Future Recommendations: Recommend- induction with short-acting agent, and alternative techniques readily available    .Critical Care Performed by: Ezequiel Essex, MD Authorized by: Ezequiel Essex, MD   Critical care provider  statement:    Critical care time (minutes):  75   Critical care time was exclusive of:  Separately billable procedures and treating other patients   Critical care was necessary to treat or prevent imminent or life-threatening deterioration of the following conditions:  Cardiac failure and respiratory failure   Critical care was time spent personally by me on the following activities:  Development of treatment plan with patient or surrogate, discussions with consultants, evaluation of patient's response to treatment, examination of patient, ordering and review of laboratory studies, ordering and review of radiographic studies, ordering and performing treatments and interventions, pulse oximetry, re-evaluation of patient's condition, review of old charts and blood draw for specimens   I assumed direction of critical care for this patient from another provider in my specialty: no     Care discussed with: admitting provider     Medications Ordered in ED Medications  albuterol (VENTOLIN HFA) 108 (90 Base) MCG/ACT inhaler 2 puff (has no administration in time range)  albuterol (PROVENTIL) (2.5 MG/3ML) 0.083% nebulizer solution (  Canceled Entry 04/16/21 2312)  albuterol (PROVENTIL,VENTOLIN) solution continuous neb ( Nebulization Canceled Entry 04/16/21 2311)  magnesium sulfate IVPB 2 g 50 mL (2 g Intravenous New Bag/Given 04/16/21 2319)  furosemide (LASIX) injection 40 mg (has no administration in time range)  ipratropium (ATROVENT) 0.02 % nebulizer solution (  Given 04/16/21 2312)  ipratropium (ATROVENT) nebulizer solution 0.5 mg (0.5 mg Nebulization Given 04/16/21 2312)  methylPREDNISolone sodium succinate (SOLU-MEDROL) 125 mg/2 mL injection 125 mg (125 mg Intravenous Given 04/16/21 2320)    ED Course  I have reviewed the triage vital signs and the nursing notes.  Pertinent labs & imaging results that were available during my care of the patient were reviewed by me and considered in my medical  decision making (Graves chart for details).    MDM Rules/Calculators/A&P                          Respiratory distress for the past several days here with hypoxia.  Placed on BiPAP on arrival.  She was given bronchodilators, nebulizers, steroids and magnesium.  Also given dose of IV Lasix.  EKG without acute ischemia.  Cardiac silhouette enlarged on chest x-ray but no pericardial effusion seen on ultrasound.  ABG without significant CO2 retention.  EKG without acute ischemia.  Troponin is 983.  Patient given aspirin.  She denies any chest pain.  She is found to be flu positive.  Creatinine is 3 without comparison.  Patient increasingly somnolent on BiPAP.  She was given Narcan with no effect.  No pain medications given here or at home.  ABG shows worsening pH and CO2 retention.  Remains increasingly obtunded and decision made to intubate.  Patient's son and significant other updated.  Patient intubated without incident.  Ventilator adjusted to compensate for respiratory acidosis.  CT head is negative. Good urine output after Lasix with improvement on chest x-ray appearance Heparin initiated for suspected ACS given elevated troponin and evidence of heart failure.  EKG without acute ischemia.  Admission to [facility], ICU discussed with Dr. Carson Myrtle.   Katherine Graves was evaluated in Emergency Department on 04/17/2021 for the symptoms described in the history of present illness. She was evaluated in the context of the global COVID-19 pandemic, which necessitated consideration that the patient might be at risk for infection with the SARS-CoV-2 virus that causes COVID-19. Institutional protocols and algorithms that pertain to the evaluation of patients at risk for COVID-19 are in a state of rapid change based on information released by regulatory bodies including the CDC and federal and state organizations. These policies and algorithms were followed during the patient's care in the ED.   Final  Clinical Impression(s) / ED Diagnoses Final diagnoses:  Acute respiratory failure with hypoxia and hypercapnia (HCC)  Acute on chronic congestive heart failure, unspecified heart failure type [facility])    Rx / DC Orders ED Discharge Orders     None        Georgia Baria, Annie Main, MD 04/17/21 0532

## 2021-04-16 NOTE — ED Triage Notes (Signed)
Pt arrived via [facility]. Pt received 2 Neb Treatments PTA when Pt was found to have O2 Sats in 50's on Room Air upon EMS arrival. Pts noted to have audible wheezing.

## 2021-04-16 NOTE — ED Notes (Signed)
Date and time results received: 04/16/21 2330   Test: TROPONIN Critical Value: 983  Name of Provider Notified: Rancour, MD

## 2021-04-17 ENCOUNTER — Emergency Department (HOSPITAL_COMMUNITY): Payer: BC Managed Care – PPO

## 2021-04-17 ENCOUNTER — Inpatient Hospital Stay (HOSPITAL_COMMUNITY): Payer: BC Managed Care – PPO

## 2021-04-17 DIAGNOSIS — J9602 Acute respiratory failure with hypercapnia: Secondary | ICD-10-CM | POA: Diagnosis present

## 2021-04-17 DIAGNOSIS — E039 Hypothyroidism, unspecified: Secondary | ICD-10-CM | POA: Diagnosis present

## 2021-04-17 DIAGNOSIS — Z791 Long term (current) use of non-steroidal anti-inflammatories (NSAID): Secondary | ICD-10-CM | POA: Diagnosis not present

## 2021-04-17 DIAGNOSIS — J1100 Influenza due to unidentified influenza virus with unspecified type of pneumonia: Secondary | ICD-10-CM

## 2021-04-17 DIAGNOSIS — N1831 Chronic kidney disease, stage 3a: Secondary | ICD-10-CM | POA: Diagnosis present

## 2021-04-17 DIAGNOSIS — N179 Acute kidney failure, unspecified: Secondary | ICD-10-CM

## 2021-04-17 DIAGNOSIS — Z79890 Hormone replacement therapy (postmenopausal): Secondary | ICD-10-CM | POA: Diagnosis not present

## 2021-04-17 DIAGNOSIS — J9601 Acute respiratory failure with hypoxia: Secondary | ICD-10-CM

## 2021-04-17 DIAGNOSIS — I248 Other forms of acute ischemic heart disease: Secondary | ICD-10-CM | POA: Diagnosis present

## 2021-04-17 DIAGNOSIS — J1000 Influenza due to other identified influenza virus with unspecified type of pneumonia: Secondary | ICD-10-CM | POA: Diagnosis present

## 2021-04-17 DIAGNOSIS — Z20822 Contact with and (suspected) exposure to covid-19: Secondary | ICD-10-CM | POA: Diagnosis present

## 2021-04-17 DIAGNOSIS — E876 Hypokalemia: Secondary | ICD-10-CM

## 2021-04-17 DIAGNOSIS — I509 Heart failure, unspecified: Secondary | ICD-10-CM | POA: Diagnosis not present

## 2021-04-17 DIAGNOSIS — Z79899 Other long term (current) drug therapy: Secondary | ICD-10-CM | POA: Diagnosis not present

## 2021-04-17 DIAGNOSIS — J45909 Unspecified asthma, uncomplicated: Secondary | ICD-10-CM | POA: Diagnosis present

## 2021-04-17 DIAGNOSIS — I5033 Acute on chronic diastolic (congestive) heart failure: Secondary | ICD-10-CM | POA: Diagnosis present

## 2021-04-17 DIAGNOSIS — M109 Gout, unspecified: Secondary | ICD-10-CM | POA: Diagnosis present

## 2021-04-17 DIAGNOSIS — Z6839 Body mass index (BMI) 39.0-39.9, adult: Secondary | ICD-10-CM | POA: Diagnosis not present

## 2021-04-17 DIAGNOSIS — G4733 Obstructive sleep apnea (adult) (pediatric): Secondary | ICD-10-CM | POA: Diagnosis present

## 2021-04-17 DIAGNOSIS — I130 Hypertensive heart and chronic kidney disease with heart failure and stage 1 through stage 4 chronic kidney disease, or unspecified chronic kidney disease: Secondary | ICD-10-CM | POA: Diagnosis present

## 2021-04-17 DIAGNOSIS — I272 Pulmonary hypertension, unspecified: Secondary | ICD-10-CM | POA: Diagnosis present

## 2021-04-17 DIAGNOSIS — R0602 Shortness of breath: Secondary | ICD-10-CM | POA: Diagnosis present

## 2021-04-17 DIAGNOSIS — J9622 Acute and chronic respiratory failure with hypercapnia: Secondary | ICD-10-CM | POA: Diagnosis present

## 2021-04-17 DIAGNOSIS — J9621 Acute and chronic respiratory failure with hypoxia: Secondary | ICD-10-CM | POA: Diagnosis present

## 2021-04-17 LAB — BLOOD GAS, ARTERIAL
Acid-Base Excess: 2.6 mmol/L — ABNORMAL HIGH (ref 0.0–2.0)
Acid-Base Excess: 1.5 mmol/L (ref 0.0–2.0)
Bicarbonate: 25.7 mmol/L (ref 20.0–28.0)
Bicarbonate: 24.9 mmol/L (ref 20.0–28.0)
Drawn by: 22223
Drawn by: 22223
FIO2: 35
FIO2: 60
O2 Saturation: 90 %
O2 Saturation: 96.6 %
Patient temperature: 37
Patient temperature: 37
pCO2 arterial: 61.7 mmHg — ABNORMAL HIGH (ref 32.0–48.0)
pCO2 arterial: 56.7 mmHg — ABNORMAL HIGH (ref 32.0–48.0)
pH, Arterial: 7.288 — ABNORMAL LOW (ref 7.350–7.450)
pH, Arterial: 7.302 — ABNORMAL LOW (ref 7.350–7.450)
pO2, Arterial: 76.5 mmHg — ABNORMAL LOW (ref 83.0–108.0)
pO2, Arterial: 110 mmHg — ABNORMAL HIGH (ref 83.0–108.0)

## 2021-04-17 LAB — CBC WITH DIFFERENTIAL/PLATELET
Abs Immature Granulocytes: 0.04 10*3/uL (ref 0.00–0.07)
Basophils Absolute: 0 10*3/uL (ref 0.0–0.1)
Basophils Relative: 0 %
Eosinophils Absolute: 0 10*3/uL (ref 0.0–0.5)
Eosinophils Relative: 0 %
HCT: 37.8 % (ref 36.0–46.0)
Hemoglobin: 11.4 g/dL — ABNORMAL LOW (ref 12.0–15.0)
Immature Granulocytes: 1 %
Lymphocytes Relative: 13 %
Lymphs Abs: 0.9 10*3/uL (ref 0.7–4.0)
MCH: 25.6 pg — ABNORMAL LOW (ref 26.0–34.0)
MCHC: 30.2 g/dL (ref 30.0–36.0)
MCV: 84.8 fL (ref 80.0–100.0)
Monocytes Absolute: 0.2 10*3/uL (ref 0.1–1.0)
Monocytes Relative: 3 %
Neutro Abs: 5.9 10*3/uL (ref 1.7–7.7)
Neutrophils Relative %: 83 %
Platelets: 278 10*3/uL (ref 150–400)
RBC: 4.46 MIL/uL (ref 3.87–5.11)
RDW: 17.3 % — ABNORMAL HIGH (ref 11.5–15.5)
WBC: 7 10*3/uL (ref 4.0–10.5)
nRBC: 2.1 % — ABNORMAL HIGH (ref 0.0–0.2)

## 2021-04-17 LAB — TSH
TSH: 6.864 u[IU]/mL — ABNORMAL HIGH (ref 0.350–4.500)
TSH: 0.848 u[IU]/mL (ref 0.350–4.500)

## 2021-04-17 LAB — BRAIN NATRIURETIC PEPTIDE: B Natriuretic Peptide: 434 pg/mL — ABNORMAL HIGH (ref 0.0–100.0)

## 2021-04-17 LAB — ECHOCARDIOGRAM LIMITED
Area-P 1/2: 3.27 cm2
Height: 68 in
S' Lateral: 2.7 cm
Weight: 4409.2 oz

## 2021-04-17 LAB — TROPONIN I (HIGH SENSITIVITY)
Troponin I (High Sensitivity): 1407 ng/L (ref <18)
Troponin I (High Sensitivity): 1874 ng/L (ref <18)
Troponin I (High Sensitivity): 1879 ng/L (ref <18)
Troponin I (High Sensitivity): 1544 ng/L (ref <18)

## 2021-04-17 LAB — GLUCOSE, CAPILLARY
Glucose-Capillary: 114 mg/dL — ABNORMAL HIGH (ref 70–99)
Glucose-Capillary: 117 mg/dL — ABNORMAL HIGH (ref 70–99)
Glucose-Capillary: 124 mg/dL — ABNORMAL HIGH (ref 70–99)

## 2021-04-17 LAB — BASIC METABOLIC PANEL
Anion gap: 15 (ref 5–15)
BUN: 42 mg/dL — ABNORMAL HIGH (ref 8–23)
CO2: 26 mmol/L (ref 22–32)
Calcium: 8.4 mg/dL — ABNORMAL LOW (ref 8.9–10.3)
Chloride: 99 mmol/L (ref 98–111)
Creatinine, Ser: 2.46 mg/dL — ABNORMAL HIGH (ref 0.44–1.00)
GFR, Estimated: 22 mL/min — ABNORMAL LOW (ref >60)
Glucose, Bld: 127 mg/dL — ABNORMAL HIGH (ref 70–99)
Potassium: 3.1 mmol/L — ABNORMAL LOW (ref 3.5–5.1)
Sodium: 140 mmol/L (ref 135–145)

## 2021-04-17 LAB — HIV ANTIBODY (ROUTINE TESTING W REFLEX): HIV Screen 4th Generation wRfx: NONREACTIVE

## 2021-04-17 LAB — HEPARIN LEVEL (UNFRACTIONATED): Heparin Unfractionated: 0.65 IU/mL (ref 0.30–0.70)

## 2021-04-17 LAB — AMMONIA: Ammonia: 26 umol/L (ref 9–35)

## 2021-04-17 LAB — LACTIC ACID, PLASMA: Lactic Acid, Venous: 2.4 mmol/L (ref 0.5–1.9)

## 2021-04-17 MED ORDER — POLYETHYLENE GLYCOL 3350 17 G PO PACK: 17.0000 g | PACK | Freq: Every day | ORAL | Status: DC | PRN

## 2021-04-17 MED ORDER — POTASSIUM CHLORIDE 10 MEQ/100ML IV SOLN
10.0000 meq | INTRAVENOUS | Status: AC
  Administered 2021-04-17 (×4): 10 meq via INTRAVENOUS
  Filled 2021-04-17: qty 100

## 2021-04-17 MED ORDER — ALBUTEROL SULFATE (2.5 MG/3ML) 0.083% IN NEBU: 2.5000 mg | INHALATION_SOLUTION | RESPIRATORY_TRACT | Status: DC | PRN

## 2021-04-17 MED ORDER — FENTANYL CITRATE (PF) 100 MCG/2ML IJ SOLN
100.0000 ug | INTRAMUSCULAR | Status: DC | PRN
  Administered 2021-04-18: 100 ug via INTRAVENOUS
  Filled 2021-04-17 (×2): qty 2

## 2021-04-17 MED ORDER — SUCCINYLCHOLINE CHLORIDE 20 MG/ML IJ SOLN
INTRAMUSCULAR | Status: AC | PRN
  Administered 2021-04-17: 150 mg via INTRAVENOUS

## 2021-04-17 MED ORDER — HEPARIN BOLUS VIA INFUSION
4000.0000 [IU] | Freq: Once | INTRAVENOUS | Status: AC
  Administered 2021-04-17: 4000 [IU] via INTRAVENOUS

## 2021-04-17 MED ORDER — NALOXONE HCL 2 MG/2ML IJ SOSY
PREFILLED_SYRINGE | INTRAMUSCULAR | Status: AC
  Administered 2021-04-17: 1 mg
  Filled 2021-04-17: qty 2

## 2021-04-17 MED ORDER — HEPARIN (PORCINE) 25000 UT/250ML-% IV SOLN
1200.0000 [IU]/h | INTRAVENOUS | Status: DC
  Administered 2021-04-17 (×2): 1500 [IU]/h via INTRAVENOUS
  Filled 2021-04-17 (×2): qty 250

## 2021-04-17 MED ORDER — AMLODIPINE BESYLATE 5 MG PO TABS
5.0000 mg | ORAL_TABLET | Freq: Every day | ORAL | Status: DC
  Administered 2021-04-17 – 2021-04-21 (×5): 5 mg
  Filled 2021-04-17 (×5): qty 1

## 2021-04-17 MED ORDER — LACTATED RINGERS IV SOLN: INTRAVENOUS | Status: DC

## 2021-04-17 MED ORDER — ETOMIDATE 2 MG/ML IV SOLN
INTRAVENOUS | Status: AC
  Filled 2021-04-17: qty 20

## 2021-04-17 MED ORDER — FENTANYL CITRATE PF 50 MCG/ML IJ SOSY
100.0000 ug | PREFILLED_SYRINGE | INTRAMUSCULAR | Status: AC | PRN
  Administered 2021-04-17 (×3): 100 ug via INTRAVENOUS
  Filled 2021-04-17 (×3): qty 2

## 2021-04-17 MED ORDER — DOCUSATE SODIUM 50 MG/5ML PO LIQD
100.0000 mg | Freq: Two times a day (BID) | ORAL | Status: DC | PRN
  Filled 2021-04-17: qty 10

## 2021-04-17 MED ORDER — ETOMIDATE 2 MG/ML IV SOLN
INTRAVENOUS | Status: AC | PRN
  Administered 2021-04-17: 20 mg via INTRAVENOUS

## 2021-04-17 MED ORDER — FUROSEMIDE 10 MG/ML IJ SOLN
40.0000 mg | Freq: Once | INTRAMUSCULAR | Status: AC
  Administered 2021-04-17: 40 mg via INTRAVENOUS
  Filled 2021-04-17: qty 4

## 2021-04-17 MED ORDER — FENTANYL CITRATE PF 50 MCG/ML IJ SOSY
100.0000 ug | PREFILLED_SYRINGE | INTRAMUSCULAR | Status: DC | PRN
  Administered 2021-04-17: 100 ug via INTRAVENOUS
  Filled 2021-04-17: qty 2

## 2021-04-17 MED ORDER — CHLORHEXIDINE GLUCONATE CLOTH 2 % EX PADS
6.0000 | MEDICATED_PAD | Freq: Every day | CUTANEOUS | Status: DC
  Administered 2021-04-17 – 2021-04-19 (×3): 6 via TOPICAL

## 2021-04-17 MED ORDER — SUCCINYLCHOLINE CHLORIDE 200 MG/10ML IV SOSY
PREFILLED_SYRINGE | INTRAVENOUS | Status: AC
  Filled 2021-04-17: qty 10

## 2021-04-17 MED ORDER — NALOXONE HCL 0.4 MG/ML IJ SOLN: 1.0000 mg | Freq: Once | INTRAMUSCULAR | Status: DC

## 2021-04-17 MED ORDER — POTASSIUM CHLORIDE 20 MEQ PO PACK
40.0000 meq | PACK | Freq: Once | ORAL | Status: AC
Start: 2021-04-17 — End: 2021-04-17
  Administered 2021-04-17: 40 meq
  Filled 2021-04-17: qty 2

## 2021-04-17 MED ORDER — PROPOFOL 1000 MG/100ML IV EMUL
5.0000 ug/kg/min | INTRAVENOUS | Status: DC
Start: 2021-04-17 — End: 2021-04-18
  Administered 2021-04-17 (×2): 30 ug/kg/min via INTRAVENOUS
  Administered 2021-04-17: 5 ug/kg/min via INTRAVENOUS
  Administered 2021-04-18: 20 ug/kg/min via INTRAVENOUS
  Filled 2021-04-17 (×5): qty 100

## 2021-04-17 MED ORDER — ACETAMINOPHEN 160 MG/5ML PO SOLN
650.0000 mg | ORAL | Status: DC | PRN
  Filled 2021-04-17: qty 20.3

## 2021-04-17 MED ORDER — IPRATROPIUM-ALBUTEROL 0.5-2.5 (3) MG/3ML IN SOLN: 3.0000 mL | Freq: Four times a day (QID) | RESPIRATORY_TRACT | Status: DC | PRN

## 2021-04-17 MED ORDER — PANTOPRAZOLE SODIUM 40 MG IV SOLR
40.0000 mg | Freq: Every day | INTRAVENOUS | Status: DC
  Administered 2021-04-17: 40 mg via INTRAVENOUS
  Filled 2021-04-17: qty 40

## 2021-04-17 MED ORDER — ONDANSETRON HCL 4 MG/2ML IJ SOLN: 4.0000 mg | Freq: Four times a day (QID) | INTRAMUSCULAR | Status: DC | PRN

## 2021-04-17 MED ORDER — MIDAZOLAM HCL 2 MG/2ML IJ SOLN
2.0000 mg | INTRAMUSCULAR | Status: DC | PRN
  Administered 2021-04-17: 2 mg via INTRAVENOUS
  Filled 2021-04-17: qty 2

## 2021-04-17 MED ORDER — MIDAZOLAM HCL 2 MG/2ML IJ SOLN
2.0000 mg | INTRAMUSCULAR | Status: AC | PRN
  Administered 2021-04-17 (×3): 2 mg via INTRAVENOUS
  Filled 2021-04-17 (×3): qty 2

## 2021-04-17 MED ORDER — OSELTAMIVIR PHOSPHATE 30 MG PO CAPS
30.0000 mg | ORAL_CAPSULE | Freq: Two times a day (BID) | ORAL | Status: DC
  Administered 2021-04-17 – 2021-04-20 (×6): 30 mg
  Filled 2021-04-17 (×7): qty 1

## 2021-04-17 NOTE — Progress Notes (Signed)
ANTICOAGULATION CONSULT NOTE -   Pharmacy Consult for Heparin Indication: chest pain/ACS  No Known Allergies  Patient Measurements: Height: 5\' 8"  (172.7 cm) Weight: 125 kg (275 lb 9.2 oz) IBW/kg (Calculated) : 63.9 Heparin Dosing Weight: 95 kg  Vital Signs: BP: 158/77 (11/19 1303) Pulse Rate: 66 (11/19 1303)  Labs: Recent Labs    04/16/21 2226 04/17/21 0110 04/17/21 1011 04/17/21 1216  HGB 10.3*  --  11.4*  --   HCT 35.3*  --  37.8  --   PLT 266  --  278  --   HEPARINUNFRC  --   --   --  0.65  CREATININE 3.03*  --  2.46*  --   TROPONINIHS 983* 1,407* 1,874* 1,879*     Estimated Creatinine Clearance: 33.5 mL/min (A) (by C-G formula based on SCr of 2.46 mg/dL (H)).   Medical History: Past Medical History:  Diagnosis Date   CHF (congestive heart failure) (HCC)    Hypertension    Thyroid disease     Medications:  No current facility-administered medications on file prior to encounter.   Current Outpatient Medications on File Prior to Encounter  Medication Sig Dispense Refill   allopurinol (ZYLOPRIM) 100 MG tablet Take 100 mg by mouth daily.     amLODipine-benazepril (LOTREL) 5-40 MG capsule Take 1 capsule by mouth daily.     atorvastatin (LIPITOR) 10 MG tablet Take 10 mg by mouth daily.     hydrALAZINE (APRESOLINE) 50 MG tablet Take 50 mg by mouth 3 (three) times daily.     levothyroxine (SYNTHROID, LEVOTHROID) 125 MCG tablet Take 125 mcg by mouth daily before breakfast.     metoprolol succinate (TOPROL-XL) 100 MG 24 hr tablet Take 100 mg by mouth daily.     metoprolol succinate (TOPROL-XL) 50 MG 24 hr tablet      naproxen (NAPROSYN) 500 MG tablet Take 1 tablet (500 mg total) by mouth 2 (two) times daily with a meal. 60 tablet 5     Assessment: 60 y.o. female admitted with VDRF and elevated troponin, possible ACS, for heparin  HL 0.65-  therapeutic Trop 1407 > 1879  Goal of Therapy:  Heparin level 0.3-0.7 units/ml Monitor platelets by anticoagulation  protocol: Yes   Plan:  Continue heparin infusion at 1500 units/hr Check heparin level in 8 hours and daily. Continue to monitor H&H and platelets.  77, PharmD Clinical Pharmacist 04/17/2021 2:11 PM

## 2021-04-17 NOTE — ED Notes (Signed)
Spoke with pt son, Koleen Nimrod, and updated on pt status and that we are going to be intubating pt. Son was okay with this plan.

## 2021-04-17 NOTE — Progress Notes (Signed)
  Echocardiogram 2D Echocardiogram has been performed.  Roosvelt Maser F 04/17/2021, 3:30 PM

## 2021-04-17 NOTE — Progress Notes (Signed)
ANTICOAGULATION CONSULT NOTE - Initial Consult  Pharmacy Consult for Heparin Indication: chest pain/ACS  No Known Allergies  Patient Measurements: Height: 5\' 8"  (172.7 cm) Weight: 125 kg (275 lb 9.2 oz) IBW/kg (Calculated) : 63.9 Heparin Dosing Weight: 95 kg  Vital Signs: Temp: 97.9 F (36.6 C) (11/18 2225) Temp Source: Oral (11/18 2225) BP: 148/87 (11/19 0345) Pulse Rate: 65 (11/19 0345)  Labs: Recent Labs    04/16/21 2226 04/17/21 0110  HGB 10.3*  --   HCT 35.3*  --   PLT 266  --   CREATININE 3.03*  --   TROPONINIHS 983* 1,407*    Estimated Creatinine Clearance: 27.2 mL/min (A) (by C-G formula based on SCr of 3.03 mg/dL (H)).   Medical History: Past Medical History:  Diagnosis Date   CHF (congestive heart failure) (HCC)    Hypertension    Thyroid disease     Medications:  No current facility-administered medications on file prior to encounter.   Current Outpatient Medications on File Prior to Encounter  Medication Sig Dispense Refill   allopurinol (ZYLOPRIM) 100 MG tablet Take 100 mg by mouth daily.     amLODipine-benazepril (LOTREL) 5-40 MG capsule Take 1 capsule by mouth daily.     hydrALAZINE (APRESOLINE) 50 MG tablet Take 50 mg by mouth 3 (three) times daily.     levothyroxine (SYNTHROID, LEVOTHROID) 125 MCG tablet Take 125 mcg by mouth daily before breakfast.     metoprolol succinate (TOPROL-XL) 50 MG 24 hr tablet      naproxen (NAPROSYN) 500 MG tablet Take 1 tablet (500 mg total) by mouth 2 (two) times daily with a meal. 60 tablet 5     Assessment: 60 y.o. female admitted with VDRF and elevated troponin, possible ACS, for heparin Goal of Therapy:  Heparin level 0.3-0.7 units/ml Monitor platelets by anticoagulation protocol: Yes   Plan:  Heparin 4000 units IV bolus, then start heparin 1500 units/hr Check heparin level in 8 hours.   77 04/17/2021,4:02 AM

## 2021-04-17 NOTE — ED Notes (Signed)
Pt transported to CT and back to room with this nurse and Cala Bradford, RT with monitor and vent-pt started bucking vent on way back from CT- attempted to reach arm up and pull out ET tube.

## 2021-04-17 NOTE — H&P (Addendum)
NAME:  Katherine Graves, MRN:  [id number], DOB:  1960-09-17, LOS: 0 ADMISSION DATE:  04/16/2021, CONSULTATION DATE:  04/17/21 REFERRING MD:  Rancour - [facility], CHIEF COMPLAINT:  respiratory distress    History of Present Illness:  60 yo F PMH hypothyroidism, HTN, gout, patient reported asthma and CHF who presented to [facility]18 with SOB. SOB began several days prior to presentation with associated dry cough, no chest pain, leg pain, swelling. On EMS arrival SpO2 50%, placed on O2 and given 2x nebs. In ED, placed on BiPAP, given lasix, steroids, Bds and mag. Per ED report pt takes albuterol as needed but does not know what other home medications she takes. She was found to be Flu A positive.  Her respiratory and mental status declined despite BiPAP support. Narcan was given without effect (no opioids were administered in ED) and ABG showed worse hypercarbia. Pt intubated in ED. While in ED, trop-I also resulted elevated at 1400. ECG was without signs of acute ischemia but patient was started on heparin infusion trop-I continued to increase to 1900.   Accepted to [facility] ICU in admission    Labs on arrival  ABG 7.3/56/110 Na 140 K 3.1 Glu 127 BUN 42 Cr 2.46 Trop 1407, 1874 , 1879 LA 2.4 WBC 7 hgb 11 HCT 38 and plt 278  Pertinent  Medical History  Morbid obesity HTN Hypothyroidism Possible asthma Possible CHF  Significant Hospital Events: Including procedures, antibiotic start and stop dates in addition to other pertinent events   11/18 to ED with resp distress, hypoxia. Flu A positive. Developed hypercarbia 11/19 worse resp distress despite BiPAP. Worse hypercarbia and mentation. Started on heparin gtt from elevated trops. Intubated, transferred to [facility] ICU   Interim History / Subjective:  Arrives to [facility] ICU intubated   Objective   Blood pressure (!) 158/77, pulse 66, temperature 97.9 F (36.6 C), temperature source Oral, resp. rate (!) 24, height 5\' 8"  (1.727 m), weight 125 kg, SpO2 100 %.     Vent Mode: PRVC FiO2 (%):  [45 %-100 %] 45 % Set Rate:  [20 bmp-24 bmp] 24 bmp Vt Set:  [540 mL] 540 mL PEEP:  [5 cmH20] 5 cmH20 Plateau Pressure:  [11 cmH20-22 cmH20] 15 cmH20   Intake/Output Summary (Last 24 hours) at 04/17/2021 1413 Last data filed at 04/17/2021 1302 Gross per 24 hour  Intake 50 ml  Output 3550 ml  Net -3500 ml   Filed Weights   04/16/21 2238  Weight: 125 kg    Examination: General: chronically and critically ill appearing obese middle aged F intubated sedated NAD HENT: Exophthalmos. ETT secure. Pink mmm Lungs: Scattered crackles. No wheeze. Symmetrical chest expansion Cardiovascular: rrr s1s2 no rgm  Abdomen: obese soft ndnt  Extremities: Bilateral hyperpigmentation and chronic deformity of dorsal surface of feet. No acute joint deformity no cyanosis or clubbing  Neuro: sedated pinpoint pupils does not follow commands GU: foley, clear yellow urine  Resolved Hospital Problem list     Assessment & Plan:   Acute respiratory failure with hypoxia and hypercarbia Influenza A PNA Pulmonary edema -given steroids, lasix, Bds at [facility] ED  -don't think this is AECOPD. Possible underlying OSA/OHS. Do think possible Acute HF exacerbation, pulm edema in that setting P -repeat CXR -full MV support -PRN ABG -PRN lasix --will hold further diuresis 11/19 as pt seems to be responding well to what has been given at OSH -WUA/SBT starting tomorrow  HTN Suspected acute on chronic CHF  -reported per  pt to EDP  -unclear what antihypertensives pt actually takes at home (I see lotrel, hydral, metop) P -ECHO -holding further diuresis, will reassess in AM -will start enteral amlodipine and reassess as needed  Hypokalemia P -replace  -trend  Hypothyroidism P -hold home synthroid-- want to get appropriate med rec and see what she is actually taking -check TSH   Elevated troponin -low suspicion for ACS, probably demand P -keep hep gtt for now, recheck ECG and  trops   Best Practice (right click and "Reselect all SmartList Selections" daily)   Diet/type: tubefeeds DVT prophylaxis:  GI prophylaxis: PPI Lines: N/A Foley:  Yes, and it is still needed Code Status:  full code Last date of multidisciplinary goals of care discussion [pending]  Labs   CBC: Recent Labs  Lab 04/16/21 2226 04/17/21 1011  WBC 7.3 7.0  NEUTROABS 5.7 5.9  HGB 10.3* 11.4*  HCT 35.3* 37.8  MCV 89.8 84.8  PLT 266 278    Basic Metabolic Panel: Recent Labs  Lab 04/16/21 2226 04/17/21 1011  NA 138 140  K 3.8 3.1*  CL 98 99  CO2 26 26  GLUCOSE 116* 127*  BUN 38* 42*  CREATININE 3.03* 2.46*  CALCIUM 8.1* 8.4*   GFR: Estimated Creatinine Clearance: 33.5 mL/min (A) (by C-G formula based on SCr of 2.46 mg/dL (H)). Recent Labs  Lab 04/16/21 2226 04/17/21 0110 04/17/21 1011  WBC 7.3  --  7.0  LATICACIDVEN 2.6* 2.4*  --     Liver Function Tests: No results for input(s): AST, ALT, ALKPHOS, BILITOT, PROT, ALBUMIN in the last 168 hours. No results for input(s): LIPASE, AMYLASE in the last 168 hours. Recent Labs  Lab 04/17/21 0109  AMMONIA 26    ABG    Component Value Date/Time   PHART 7.302 (L) 04/17/2021 0238   PCO2ART 56.7 (H) 04/17/2021 0238   PO2ART 110 (H) 04/17/2021 0238   HCO3 24.9 04/17/2021 0238   ACIDBASEDEF 2.8 (H) 04/16/2021 2335   O2SAT 96.6 04/17/2021 0238     Coagulation Profile: No results for input(s): INR, PROTIME in the last 168 hours.  Cardiac Enzymes: No results for input(s): CKTOTAL, CKMB, CKMBINDEX, TROPONINI in the last 168 hours.  HbA1C: No results found for: HGBA1C  CBG: No results for input(s): GLUCAP in the last 168 hours.  Review of Systems:   Unable to obtain intubated sedated  Past Medical History:  She,  has a past medical history of CHF (congestive heart failure) (HCC), Hypertension, and Thyroid disease.   Surgical History:  History reviewed. No pertinent surgical history.   Social History:    reports that she has never smoked. She has never used smokeless tobacco. She reports that she does not drink alcohol and does not use drugs.   Family History:  Her family history is not on file.   Allergies No Known Allergies   Home Medications  Prior to Admission medications   Medication Sig Start Date End Date Taking? Authorizing Provider  allopurinol (ZYLOPRIM) 100 MG tablet Take 100 mg by mouth daily.    [provider]  amLODipine-benazepril (LOTREL) 5-40 MG capsule Take 1 capsule by mouth daily.    [provider]  atorvastatin (LIPITOR) 10 MG tablet Take 10 mg by mouth daily. 04/03/21   [provider]  hydrALAZINE (APRESOLINE) 50 MG tablet Take 50 mg by mouth 3 (three) times daily.    [provider]  levothyroxine (SYNTHROID, LEVOTHROID) 125 MCG tablet Take 125 mcg by mouth daily before  breakfast.    [provider]  metoprolol succinate (TOPROL-XL) 100 MG 24 hr tablet Take 100 mg by mouth daily. 04/09/21   [provider]  metoprolol succinate (TOPROL-XL) 50 MG 24 hr tablet  02/07/19   [provider]  naproxen (NAPROSYN) 500 MG tablet Take 1 tablet (500 mg total) by mouth 2 (two) times daily with a meal. 03/19/19   Darreld Mclean, MD     Critical care time: 45 min    CRITICAL CARE Performed by: Lanier Clam   Total critical care time: 45 minutes  Critical care time was exclusive of separately billable procedures and treating other patients.\ Critical care was necessary to treat or prevent imminent or life-threatening deterioration.  Critical care was time spent personally by me on the following activities: development of treatment plan with patient and/or surrogate as well as nursing, discussions with consultants, evaluation of patient's response to treatment, examination of patient, obtaining history from patient or surrogate, ordering and performing treatments and interventions, ordering and review of  laboratory studies, ordering and review of radiographic studies, pulse oximetry and re-evaluation of patient's condition.  Tessie Fass MSN, AGACNP-BC [facility] Pulmonary/Critical Care Medicine Amion for pager 04/17/2021, 3:10 PM

## 2021-04-17 NOTE — ED Notes (Signed)
Pt resting at this time- sedation effective

## 2021-04-17 NOTE — ED Notes (Signed)
Patient transported to CT 

## 2021-04-17 NOTE — Sedation Documentation (Signed)
Date and time results received: 04/17/21 1139  Test: Troponin Critical Value: 1874  Name of Provider Notified: Dr. Charm Barges  Orders Received? Or Actions Taken?: Orders Received - See Orders for details

## 2021-04-17 NOTE — Progress Notes (Signed)
MD notified of pt's troponins results. No new orders

## 2021-04-17 NOTE — ED Provider Notes (Signed)
Signout from Dr. Manus Gunning.  60 year old lady shortness of breath requiring intubation.  Troponins rising.  Currently on heparin.  Plan is for ICU bed at: When bed available. Physical Exam  BP (!) 158/77   Pulse 66   Temp 97.9 F (36.6 C) (Oral)   Resp (!) 24   Ht 5\' 8"  (1.727 m)   Wt 125 kg   SpO2 100%   BMI 41.90 kg/m   Physical Exam  ED Course/Procedures     Procedures  MDM  Patient became much more alert and was agitated with the endotracheal tube and so was put on some propofol for sedation.  1320 -[facility] here for transport to [facility].       , MD 04/17/21 [phone]

## 2021-04-17 NOTE — Code Documentation (Signed)
Dr Manus Gunning spoke with family by telephone,

## 2021-04-17 NOTE — Sedation Documentation (Signed)
[facility] arrived to transport patient.  1 bottle of propofol sent with transport to [facility].

## 2021-04-18 ENCOUNTER — Inpatient Hospital Stay (HOSPITAL_COMMUNITY): Payer: BC Managed Care – PPO

## 2021-04-18 DIAGNOSIS — J9602 Acute respiratory failure with hypercapnia: Secondary | ICD-10-CM | POA: Diagnosis not present

## 2021-04-18 DIAGNOSIS — N179 Acute kidney failure, unspecified: Secondary | ICD-10-CM | POA: Diagnosis not present

## 2021-04-18 DIAGNOSIS — J9601 Acute respiratory failure with hypoxia: Secondary | ICD-10-CM | POA: Diagnosis not present

## 2021-04-18 DIAGNOSIS — J1100 Influenza due to unidentified influenza virus with unspecified type of pneumonia: Secondary | ICD-10-CM | POA: Diagnosis not present

## 2021-04-18 LAB — CBC
HCT: 35.7 % — ABNORMAL LOW (ref 36.0–46.0)
Hemoglobin: 10.8 g/dL — ABNORMAL LOW (ref 12.0–15.0)
MCH: 25.5 pg — ABNORMAL LOW (ref 26.0–34.0)
MCHC: 30.3 g/dL (ref 30.0–36.0)
MCV: 84.2 fL (ref 80.0–100.0)
Platelets: 284 10*3/uL (ref 150–400)
RBC: 4.24 MIL/uL (ref 3.87–5.11)
RDW: 17.4 % — ABNORMAL HIGH (ref 11.5–15.5)
WBC: 11.7 10*3/uL — ABNORMAL HIGH (ref 4.0–10.5)
nRBC: 2.1 % — ABNORMAL HIGH (ref 0.0–0.2)

## 2021-04-18 LAB — TRIGLYCERIDES: Triglycerides: 129 mg/dL (ref <150)

## 2021-04-18 LAB — BASIC METABOLIC PANEL
Anion gap: 12 (ref 5–15)
BUN: 40 mg/dL — ABNORMAL HIGH (ref 8–23)
CO2: 27 mmol/L (ref 22–32)
Calcium: 8.5 mg/dL — ABNORMAL LOW (ref 8.9–10.3)
Chloride: 101 mmol/L (ref 98–111)
Creatinine, Ser: 2.32 mg/dL — ABNORMAL HIGH (ref 0.44–1.00)
GFR, Estimated: 23 mL/min — ABNORMAL LOW (ref >60)
Glucose, Bld: 115 mg/dL — ABNORMAL HIGH (ref 70–99)
Potassium: 3.9 mmol/L (ref 3.5–5.1)
Sodium: 140 mmol/L (ref 135–145)

## 2021-04-18 LAB — MAGNESIUM
Magnesium: 1.8 mg/dL (ref 1.7–2.4)
Magnesium: 1.9 mg/dL (ref 1.7–2.4)
Magnesium: 1.8 mg/dL (ref 1.7–2.4)

## 2021-04-18 LAB — PHOSPHORUS
Phosphorus: 4.6 mg/dL (ref 2.5–4.6)
Phosphorus: 3.3 mg/dL (ref 2.5–4.6)

## 2021-04-18 LAB — GLUCOSE, CAPILLARY
Glucose-Capillary: 90 mg/dL (ref 70–99)
Glucose-Capillary: 66 mg/dL — ABNORMAL LOW (ref 70–99)
Glucose-Capillary: 96 mg/dL (ref 70–99)
Glucose-Capillary: 71 mg/dL (ref 70–99)
Glucose-Capillary: 108 mg/dL — ABNORMAL HIGH (ref 70–99)
Glucose-Capillary: 115 mg/dL — ABNORMAL HIGH (ref 70–99)
Glucose-Capillary: 106 mg/dL — ABNORMAL HIGH (ref 70–99)

## 2021-04-18 LAB — HEPARIN LEVEL (UNFRACTIONATED): Heparin Unfractionated: 0.97 IU/mL — ABNORMAL HIGH (ref 0.30–0.70)

## 2021-04-18 MED ORDER — VITAL HIGH PROTEIN PO LIQD
1000.0000 mL | ORAL | Status: DC
  Administered 2021-04-18: 1000 mL

## 2021-04-18 MED ORDER — FUROSEMIDE 10 MG/ML IJ SOLN
40.0000 mg | Freq: Once | INTRAMUSCULAR | Status: AC
  Administered 2021-04-18: 40 mg via INTRAVENOUS
  Filled 2021-04-18: qty 4

## 2021-04-18 MED ORDER — FENTANYL CITRATE (PF) 100 MCG/2ML IJ SOLN: 50.0000 ug | Freq: Once | INTRAMUSCULAR | Status: DC

## 2021-04-18 MED ORDER — MAGNESIUM SULFATE 2 GM/50ML IV SOLN
2.0000 g | Freq: Once | INTRAVENOUS | Status: AC
  Administered 2021-04-18: 2 g via INTRAVENOUS
  Filled 2021-04-18: qty 50

## 2021-04-18 MED ORDER — LEVOTHYROXINE SODIUM 25 MCG PO TABS: 125.0000 ug | ORAL_TABLET | Freq: Every day | ORAL | Status: DC

## 2021-04-18 MED ORDER — FENTANYL BOLUS VIA INFUSION
50.0000 ug | INTRAVENOUS | Status: DC | PRN
Start: 2021-04-18 — End: 2021-04-18
  Administered 2021-04-18 (×3): 50 ug via INTRAVENOUS
  Filled 2021-04-18: qty 100

## 2021-04-18 MED ORDER — DEXTROSE 50 % IV SOLN
INTRAVENOUS | Status: AC
  Administered 2021-04-18: 25 mL
  Filled 2021-04-18: qty 50

## 2021-04-18 MED ORDER — MIDAZOLAM-SODIUM CHLORIDE 100-0.9 MG/100ML-% IV SOLN
0.5000 mg/h | INTRAVENOUS | Status: DC
  Administered 2021-04-18: 0.5 mg/h via INTRAVENOUS
  Filled 2021-04-18: qty 100

## 2021-04-18 MED ORDER — FENTANYL 2500MCG IN NS 250ML (10MCG/ML) PREMIX INFUSION
50.0000 ug/h | INTRAVENOUS | Status: DC
  Administered 2021-04-18: 50 ug/h via INTRAVENOUS
  Filled 2021-04-18: qty 250

## 2021-04-18 MED ORDER — IOHEXOL 350 MG/ML SOLN
100.0000 mL | Freq: Once | INTRAVENOUS | Status: AC | PRN
  Administered 2021-04-18: 100 mL via INTRAVENOUS

## 2021-04-18 MED ORDER — PROSOURCE TF PO LIQD
45.0000 mL | Freq: Two times a day (BID) | ORAL | Status: DC
  Administered 2021-04-18 – 2021-04-19 (×2): 45 mL
  Filled 2021-04-18 (×2): qty 45

## 2021-04-18 MED ORDER — CHLORHEXIDINE GLUCONATE 0.12% ORAL RINSE (MEDLINE KIT)
15.0000 mL | Freq: Two times a day (BID) | OROMUCOSAL | Status: DC
  Administered 2021-04-18: 15 mL via OROMUCOSAL

## 2021-04-18 MED ORDER — LEVOTHYROXINE SODIUM 25 MCG PO TABS
125.0000 ug | ORAL_TABLET | Freq: Every day | ORAL | Status: DC
  Administered 2021-04-18 – 2021-04-20 (×3): 125 ug
  Filled 2021-04-18 (×3): qty 1

## 2021-04-18 MED ORDER — ORAL CARE MOUTH RINSE
15.0000 mL | OROMUCOSAL | Status: DC
  Administered 2021-04-18 (×5): 15 mL via OROMUCOSAL

## 2021-04-18 NOTE — Procedures (Signed)
Extubation Procedure Note  Patient Details:   Name: Katherine Graves DOB: Jul 28, 1960 MRN: [id number]   Airway Documentation:  Airway 7.5 mm (Active)  Secured at (cm) 25 cm 04/17/21 0212  Measured From Lips 04/17/21 0212  Secured Location Left 04/17/21 0212  Secured By [employer] 04/17/21 0212  Prone position No 04/17/21 0212  Cuff Pressure (cm H2O) Clear OR 27-39 CmH2O 04/17/21 0212  Site Condition Dry 04/17/21 0212   Vent end date: 04/18/21 Vent end time: 1200   Evaluation  O2 sats: stable throughout Complications: No apparent complications Patient did tolerate procedure well. Bilateral Breath Sounds: Rhonchi, Diminished  Patient was extubated to a 4L [place]. No stridor was noted, cuff leak was heard. Patient tolerated well. RN and patients son at bedside during extubation.   Yes  Darolyn Rua 04/18/2021, 12:08 PM

## 2021-04-18 NOTE — Progress Notes (Addendum)
Pt extubated to 4L [place]. All vitals wnl, pt talking.

## 2021-04-18 NOTE — Progress Notes (Signed)
NAME:  Katherine Graves, MRN:  [id number], DOB:  05-Jul-1960, LOS: 1 ADMISSION DATE:  04/16/2021, CONSULTATION DATE:  04/17/21 REFERRING MD:  Rancour - [facility] ED, CHIEF COMPLAINT:  respiratory distress    History of Present Illness:  60 yo F PMH hypothyroidism, HTN, gout, patient reported asthma and CHF who presented to [facility] 11/18 with SOB. SOB began several days prior to presentation with associated dry cough, no chest pain, leg pain, swelling. On EMS arrival SpO2 50%, placed on O2 and given 2x nebs. In ED, placed on BiPAP, given lasix, steroids, Bds and mag. Per ED report pt takes albuterol as needed but does not know what other home medications she takes. She was found to be Flu A positive.  Her respiratory and mental status declined despite BiPAP support. Narcan was given without effect (no opioids were administered in ED) and ABG showed worse hypercarbia. Pt intubated in ED. While in ED, trop-I also resulted elevated at 1400. ECG was without signs of acute ischemia but patient was started on heparin infusion trop-I continued to increase to 1900.   Accepted to [facility] ICU in admission    Labs on arrival  ABG 7.3/56/110 Na 140 K 3.1 Glu 127 BUN 42 Cr 2.46 Trop 1407, 1874 , 1879 LA 2.4 WBC 7 hgb 11 HCT 38 and plt 278  Pertinent  Medical History  Morbid obesity HTN Hypothyroidism Possible asthma Possible CHF  Significant Hospital Events: Including procedures, antibiotic start and stop dates in addition to other pertinent events   11/18 to ED with resp distress, hypoxia. Flu A positive. Developed hypercarbia 11/19 worse resp distress despite BiPAP. Worse hypercarbia and mentation. Started on heparin gtt from elevated trops. Intubated, transferred to [facility] ICU  11/20 wua sbt    Interim History / Subjective:   WUA/SBT this morning with good volumes just sleepy    Objective   Blood pressure (!) 152/79, pulse (!) 52, temperature 97.9 F (36.6 C), temperature source Oral, resp. rate 18, height  5\' 8"  (1.727 m), weight 119.5 kg, SpO2 94 %.    Vent Mode: PRVC FiO2 (%):  [40 %-45 %] 40 % Set Rate:  [18 bmp-24 bmp] 18 bmp Vt Set:  [500 mL-540 mL] 500 mL PEEP:  [5 cmH20] 5 cmH20 Plateau Pressure:  [15 cmH20-23 cmH20] 19 cmH20   Intake/Output Summary (Last 24 hours) at 04/18/2021 0948 Last data filed at 04/18/2021 0900 Gross per 24 hour  Intake 1504.55 ml  Output 2350 ml  Net -845.45 ml   Filed Weights   04/16/21 2238 04/18/21 0500  Weight: 125 kg 119.5 kg    Examination: General: Chronically ill obese middle aged F intubated NAD  HENT: Exophthalmos. ETT secure anicteric sclera  Lungs: Diminished bibasilar sounds. Symmetrical chest expansion. Even unlabored on PSV  Cardiovascular: regular s1s2 no rgm  Abdomen: obese soft round ndnt  Extremities: hyperpigmentation and chronic deformity dorsal surface of feet bilaterally. No acute deformity. No cyanosis  Neuro: Drowsy Awakens to voice following commands  GU: foley clear yellow urine   Resolved Hospital Problem list     Assessment & Plan:   Acute respiratory failure with hypoxia and hypercarbia  Influenza A PNA Pulmonary Edema -given steroids, lasix, Bds at [facility] ED  -don't think this is AECOPD. Possible underlying OSA/OHS.  P -WUA/SBT -hopefully extubate 11/20  -no tamiflu -- timing wise this wouldn't likely be helpful  -Lasix   HTN  Diastolic HF  Elevated troponin, suspect demand P -dc hep gtt -amlodipine  -diurese  as above   Hypothyroidism P -synthroid   Hypomagnesemia Hypokalemia P -replace cont to trend   Best Practice (right click and "Reselect all SmartList Selections" daily)   Diet/type: tubefeeds DVT prophylaxis:  GI prophylaxis: PPI Lines: N/A Foley:  Yes, and it is still needed Code Status:  full code Last date of multidisciplinary goals of care discussion [pending]  Labs   CBC: Recent Labs  Lab 04/16/21 2226 04/17/21 1011 04/18/21 0149  WBC 7.3 7.0 11.7*  NEUTROABS 5.7 5.9   --   HGB 10.3* 11.4* 10.8*  HCT 35.3* 37.8 35.7*  MCV 89.8 84.8 84.2  PLT 266 278 284    Basic Metabolic Panel: Recent Labs  Lab 04/16/21 2226 04/17/21 1011 04/18/21 0149 04/18/21 0725  NA 138 140 140  --   K 3.8 3.1* 3.9  --   CL 98 99 101  --   CO2 26 26 27   --   GLUCOSE 116* 127* 115*  --   BUN 38* 42* 40*  --   CREATININE 3.03* 2.46* 2.32*  --   CALCIUM 8.1* 8.4* 8.5*  --   MG  --   --  1.8 1.9  PHOS  --   --   --  4.6   GFR: Estimated Creatinine Clearance: 34.6 mL/min (A) (by C-G formula based on SCr of 2.32 mg/dL (H)). Recent Labs  Lab 04/16/21 2226 04/17/21 0110 04/17/21 1011 04/18/21 0149  WBC 7.3  --  7.0 11.7*  LATICACIDVEN 2.6* 2.4*  --   --     Liver Function Tests: No results for input(s): AST, ALT, ALKPHOS, BILITOT, PROT, ALBUMIN in the last 168 hours. No results for input(s): LIPASE, AMYLASE in the last 168 hours. Recent Labs  Lab 04/17/21 0109  AMMONIA 26    ABG    Component Value Date/Time   PHART 7.302 (L) 04/17/2021 0238   PCO2ART 56.7 (H) 04/17/2021 0238   PO2ART 110 (H) 04/17/2021 0238   HCO3 24.9 04/17/2021 0238   ACIDBASEDEF 2.8 (H) 04/16/2021 2335   O2SAT 96.6 04/17/2021 0238     Coagulation Profile: No results for input(s): INR, PROTIME in the last 168 hours.  Cardiac Enzymes: No results for input(s): CKTOTAL, CKMB, CKMBINDEX, TROPONINI in the last 168 hours.  HbA1C: No results found for: HGBA1C  CBG: Recent Labs  Lab 04/17/21 2023 04/17/21 2340 04/18/21 0428 04/18/21 0750 04/18/21 0847  GLUCAP 124* 114* 90 66* 96     CRITICAL CARE Performed by: 04/20/21   Total critical care time: 38 minutes  Critical care time was exclusive of separately billable procedures and treating other patients.  Critical care was necessary to treat or prevent imminent or life-threatening deterioration.  Critical care was time spent personally by me on the following activities: development of treatment plan with patient  and/or surrogate as well as nursing, discussions with consultants, evaluation of patient's response to treatment, examination of patient, obtaining history from patient or surrogate, ordering and performing treatments and interventions, ordering and review of laboratory studies, ordering and review of radiographic studies, pulse oximetry and re-evaluation of patient's condition.  Lanier Clam MSN, AGACNP-BC [facility] Pulmonary/Critical Care Medicine Amion for pager  04/18/2021, 9:48 AM

## 2021-04-18 NOTE — Progress Notes (Signed)
ANTICOAGULATION CONSULT NOTE   Pharmacy Consult for Heparin Indication: chest pain/ACS  No Known Allergies  Patient Measurements: Height: 5\' 8"  (172.7 cm) Weight: 125 kg (275 lb 9.2 oz) IBW/kg (Calculated) : 63.9 Heparin Dosing Weight: 95 kg  Vital Signs: BP: 152/99 (11/20 0200) Pulse Rate: 67 (11/20 0200)  Labs: Recent Labs    04/16/21 2226 04/17/21 0110 04/17/21 1011 04/17/21 1216 04/17/21 1453 04/17/21 2315 04/18/21 0149  HGB 10.3*  --  11.4*  --   --   --  10.8*  HCT 35.3*  --  37.8  --   --   --  35.7*  PLT 266  --  278  --   --   --  284  HEPARINUNFRC  --   --   --  0.65  --  0.97*  --   CREATININE 3.03*  --  2.46*  --   --   --  2.32*  TROPONINIHS 983*   < > 1,874* 1,879* 1,544*  --   --    < > = values in this interval not displayed.     Estimated Creatinine Clearance: 35.5 mL/min (A) (by C-G formula based on SCr of 2.32 mg/dL (H)).   Medical History: Past Medical History:  Diagnosis Date   CHF (congestive heart failure) (HCC)    Hypertension    Thyroid disease     Medications:  No current facility-administered medications on file prior to encounter.   Current Outpatient Medications on File Prior to Encounter  Medication Sig Dispense Refill   allopurinol (ZYLOPRIM) 100 MG tablet Take 100 mg by mouth daily.     amLODipine-benazepril (LOTREL) 5-40 MG capsule Take 1 capsule by mouth daily.     atorvastatin (LIPITOR) 10 MG tablet Take 10 mg by mouth daily.     hydrALAZINE (APRESOLINE) 50 MG tablet Take 50 mg by mouth 3 (three) times daily.     levothyroxine (SYNTHROID, LEVOTHROID) 125 MCG tablet Take 125 mcg by mouth daily before breakfast.     metoprolol succinate (TOPROL-XL) 100 MG 24 hr tablet Take 100 mg by mouth daily.     naproxen (NAPROSYN) 500 MG tablet Take 1 tablet (500 mg total) by mouth 2 (two) times daily with a meal. 60 tablet 5     Assessment: 60 y.o. female admitted with VDRF and elevated troponin, possible ACS, for heparin  11/20  AM update:  Heparin level elevated  Goal of Therapy:  Heparin level 0.3-0.7 units/ml Monitor platelets by anticoagulation protocol: Yes   Plan:  Dec heparin to 1200 units/hr 1200 heparin level  12/20, PharmD, BCPS Clinical Pharmacist Phone: [phone]

## 2021-04-18 NOTE — Progress Notes (Signed)
Changed sedation due to severe bradycardia.

## 2021-04-18 NOTE — Progress Notes (Signed)
Brief Nutrition Note  Consult received for enteral/tube feeding initiation and management.  Adult Enteral Nutrition Protocol initiated. Full assessment to follow.  Admitting Dx: Acute respiratory failure with hypoxia (HCC) [J96.01] Acute respiratory failure with hypoxia and hypercapnia (HCC) [J96.01, J96.02] Acute on chronic congestive heart failure, unspecified heart failure type (HCC) [I50.9]  Body mass index is 40.06 kg/m. Pt meets criteria for morbid obesity based on current BMI.  Labs:  Recent Labs  Lab 04/16/21 2226 04/17/21 1011 04/18/21 0149  NA 138 140 140  K 3.8 3.1* 3.9  CL 98 99 101  CO2 26 26 27   BUN 38* 42* 40*  CREATININE 3.03* 2.46* 2.32*  CALCIUM 8.1* 8.4* 8.5*  MG  --   --  1.8  GLUCOSE 116* 127* 115*     Fernandez Kenley A., MS, RD, LDN (she/her/hers) RD pager number and weekend/on-call pager number located in Amion.

## 2021-04-19 DIAGNOSIS — J9601 Acute respiratory failure with hypoxia: Secondary | ICD-10-CM | POA: Diagnosis not present

## 2021-04-19 LAB — BASIC METABOLIC PANEL
Anion gap: 12 (ref 5–15)
BUN: 38 mg/dL — ABNORMAL HIGH (ref 8–23)
CO2: 30 mmol/L (ref 22–32)
Calcium: 8.3 mg/dL — ABNORMAL LOW (ref 8.9–10.3)
Chloride: 100 mmol/L (ref 98–111)
Creatinine, Ser: 1.86 mg/dL — ABNORMAL HIGH (ref 0.44–1.00)
GFR, Estimated: 30 mL/min — ABNORMAL LOW (ref >60)
Glucose, Bld: 91 mg/dL (ref 70–99)
Potassium: 3.7 mmol/L (ref 3.5–5.1)
Sodium: 142 mmol/L (ref 135–145)

## 2021-04-19 LAB — MAGNESIUM: Magnesium: 1.7 mg/dL (ref 1.7–2.4)

## 2021-04-19 LAB — CBC
HCT: 40.9 % (ref 36.0–46.0)
Hemoglobin: 12 g/dL (ref 12.0–15.0)
MCH: 25.3 pg — ABNORMAL LOW (ref 26.0–34.0)
MCHC: 29.3 g/dL — ABNORMAL LOW (ref 30.0–36.0)
MCV: 86.1 fL (ref 80.0–100.0)
Platelets: 260 10*3/uL (ref 150–400)
RBC: 4.75 MIL/uL (ref 3.87–5.11)
RDW: 17.4 % — ABNORMAL HIGH (ref 11.5–15.5)
WBC: 9 10*3/uL (ref 4.0–10.5)
nRBC: 2.4 % — ABNORMAL HIGH (ref 0.0–0.2)

## 2021-04-19 LAB — GLUCOSE, CAPILLARY
Glucose-Capillary: 102 mg/dL — ABNORMAL HIGH (ref 70–99)
Glucose-Capillary: 84 mg/dL (ref 70–99)
Glucose-Capillary: 115 mg/dL — ABNORMAL HIGH (ref 70–99)
Glucose-Capillary: 104 mg/dL — ABNORMAL HIGH (ref 70–99)

## 2021-04-19 LAB — PHOSPHORUS: Phosphorus: 4.2 mg/dL (ref 2.5–4.6)

## 2021-04-19 MED ORDER — POTASSIUM CHLORIDE CRYS ER 20 MEQ PO TBCR
40.0000 meq | EXTENDED_RELEASE_TABLET | Freq: Once | ORAL | Status: AC
  Administered 2021-04-19: 40 meq via ORAL
  Filled 2021-04-19: qty 2

## 2021-04-19 MED ORDER — MAGNESIUM SULFATE 2 GM/50ML IV SOLN
2.0000 g | Freq: Once | INTRAVENOUS | Status: AC
  Administered 2021-04-19: 2 g via INTRAVENOUS
  Filled 2021-04-19: qty 50

## 2021-04-19 MED ORDER — DOCUSATE SODIUM 100 MG PO CAPS
100.0000 mg | ORAL_CAPSULE | Freq: Two times a day (BID) | ORAL | Status: DC
  Administered 2021-04-19 – 2021-04-21 (×5): 100 mg via ORAL
  Filled 2021-04-19 (×5): qty 1

## 2021-04-19 MED ORDER — POLYETHYLENE GLYCOL 3350 17 G PO PACK
17.0000 g | PACK | Freq: Every day | ORAL | Status: DC
Start: 2021-04-19 — End: 2021-04-21
  Administered 2021-04-19: 17 g via ORAL
  Filled 2021-04-19 (×3): qty 1

## 2021-04-19 MED ORDER — HEPARIN SODIUM (PORCINE) 5000 UNIT/ML IJ SOLN
5000.0000 [IU] | Freq: Three times a day (TID) | INTRAMUSCULAR | Status: DC
  Administered 2021-04-19 – 2021-04-21 (×6): 5000 [IU] via SUBCUTANEOUS
  Filled 2021-04-19 (×6): qty 1

## 2021-04-19 MED ORDER — CARVEDILOL 3.125 MG PO TABS
3.1250 mg | ORAL_TABLET | Freq: Two times a day (BID) | ORAL | Status: DC
  Administered 2021-04-19 – 2021-04-21 (×5): 3.125 mg via ORAL
  Filled 2021-04-19 (×5): qty 1

## 2021-04-19 MED ORDER — CARVEDILOL 3.125 MG PO TABS: 3.1250 mg | ORAL_TABLET | Freq: Two times a day (BID) | ORAL | Status: DC

## 2021-04-19 MED ORDER — ASPIRIN EC 81 MG PO TBEC
81.0000 mg | DELAYED_RELEASE_TABLET | Freq: Every day | ORAL | Status: DC
  Administered 2021-04-19 – 2021-04-21 (×3): 81 mg via ORAL
  Filled 2021-04-19 (×3): qty 1

## 2021-04-19 MED ORDER — ATORVASTATIN CALCIUM 10 MG PO TABS
10.0000 mg | ORAL_TABLET | Freq: Every day | ORAL | Status: DC
  Administered 2021-04-19 – 2021-04-21 (×3): 10 mg via ORAL
  Filled 2021-04-19 (×3): qty 1

## 2021-04-19 NOTE — Progress Notes (Signed)
Patient arrived from 734M in wheelchair with 734M RN and PT. Patient vitals obtained and stable. CBG obtained and stable. RN assessment performed and documented. Skin assessment performed with Purvis Kilts RN. Tele monitor applied and CCMD notified of admission. Call bell within reach and bed in lowest position. Patient expressed that R wrist PIV hurts and would like RN to removed. R wrist PIV removed, site is clean dry and intact but tender. Patient tolerated well.

## 2021-04-19 NOTE — Evaluation (Signed)
Physical Therapy Evaluation Patient Details Name: Katherine Graves MRN: [id number] DOB: 1961/04/27 Today's Date: 04/19/2021  History of Present Illness  Pt adm to [facility] on 11/18 with SOB and found to be flu+. Pt intubated 11/19 with respiratory distress and tranferred to [facility]. Extubated 11/20. PMH - morbid obesity, HTN, gout, CHF, hypothyroid.  Clinical Impression  Pt presents to PT with decr mobility due to weakness, decr activity tolerance, and decr balance. Expect pt should make good progress and hopefully be able to return home. Pt lives alone and feel that for her to return home she will need someone with her for the first couple of days after DC. Pt reports someone can assist her.      Recommendations for follow up therapy are one component of a multi-disciplinary discharge planning process, led by the attending physician.  Recommendations may be updated based on patient status, additional functional criteria and insurance authorization.  Follow Up Recommendations Home health PT    Assistance Recommended at Discharge Frequent or constant Supervision/Assistance (initially but likely quickly only intermittent)  Functional Status Assessment Patient has had a recent decline in their functional status and demonstrates the ability to make significant improvements in function in a reasonable and predictable amount of time.  Equipment Recommendations  Rollator (4 wheels)    Recommendations for Other Services       Precautions / Restrictions Precautions Precautions: Fall;Other (comment) Precaution Comments: watch SpO2      Mobility  Bed Mobility Overal bed mobility: Needs Assistance Bed Mobility: Sit to Supine       Sit to supine: Supervision   General bed mobility comments: supervision for safety    Transfers Overall transfer level: Needs assistance Equipment used: 1 person hand held assist;2 person hand held assist Transfers: Sit to/from Stand Sit to Stand: +2 physical  assistance;Min assist           General transfer comment: Assist to bring hips up and for stability. From chair pt only required 1 person assist. From transport chair and low bed +2 min assist    Ambulation/Gait Ambulation/Gait assistance: Min assist;+2 physical assistance Gait Distance (Feet): 10 Feet (2) Assistive device: 1 person hand held assist;2 person hand held assist Gait Pattern/deviations: Step-through pattern;Decreased stride length Gait velocity: decr Gait velocity interpretation: <1.31 ft/sec, indicative of household ambulator   General Gait Details: Assist for balance and support. Amb from chair to transport chair with 1 person assist and only slightly unsteady. Pt transported to new unit/room. 2 person assist to amb from transport chair to bed in new room with pt much more unsteady and feeling "weak".  Stairs            Wheelchair Mobility    Modified Rankin (Stroke Patients Only)       Balance Overall balance assessment: Needs assistance Sitting-balance support: No upper extremity supported;Feet supported Sitting balance-Leahy Scale: Fair     Standing balance support: Single extremity supported;Bilateral upper extremity supported Standing balance-Leahy Scale: Poor Standing balance comment: UE support and min to min guard assist for static standing                             Pertinent Vitals/Pain Pain Assessment: No/denies pain    Home Living Family/patient expects to be discharged to:: Private residence Living Arrangements: Alone Available Help at Discharge: Family Type of Home: House Home Access: Stairs to enter Entrance Stairs-Rails: Right Entrance Stairs-Number of Steps: 4   Home  Layout: One level Home Equipment: None      Prior Function Prior Level of Function : Independent/Modified Independent             Mobility Comments: No assistive device       Hand Dominance        Extremity/Trunk Assessment   Upper  Extremity Assessment Upper Extremity Assessment: Defer to OT evaluation    Lower Extremity Assessment Lower Extremity Assessment: Generalized weakness       Communication   Communication: No difficulties  Cognition Arousal/Alertness: Awake/alert Behavior During Therapy: WFL for tasks assessed/performed Overall Cognitive Status: Within Functional Limits for tasks assessed                                          General Comments General comments (skin integrity, edema, etc.): Pt on 4L O2 at rest with SpO2 98%. Removed O2 and SpO2 dropped to 86% after 2-3 minutes.    Exercises     Assessment/Plan    PT Assessment Patient needs continued PT services  PT Problem List Decreased strength;Decreased balance;Decreased mobility;Cardiopulmonary status limiting activity;Decreased activity tolerance;Obesity       PT Treatment Interventions DME instruction;Functional mobility training;Balance training;Patient/family education;Gait training;Therapeutic activities;Stair training;Therapeutic exercise    PT Goals (Current goals can be found in the Care Plan section)  Acute Rehab PT Goals Patient Stated Goal: return home PT Goal Formulation: With patient Time For Goal Achievement: 05/03/21 Potential to Achieve Goals: Good    Frequency Min 3X/week   Barriers to discharge Decreased caregiver support Lives alone but pt states someone can stay with her at dc    Co-evaluation               AM-PAC PT "6 Clicks" Mobility  Outcome Measure Help needed turning from your back to your side while in a flat bed without using bedrails?: None Help needed moving from lying on your back to sitting on the side of a flat bed without using bedrails?: A Little Help needed moving to and from a bed to a chair (including a wheelchair)?: A Little Help needed standing up from a chair using your arms (e.g., wheelchair or bedside chair)?: A Little Help needed to walk in hospital room?: A  Little Help needed climbing 3-5 steps with a railing? : A Lot 6 Click Score: 18    End of Session Equipment Utilized During Treatment: Oxygen Activity Tolerance: Patient limited by fatigue Patient left: in bed;with nursing/sitter in room (Pt on new unit with nurses present for their assessment) Nurse Communication: Mobility status PT Visit Diagnosis: Unsteadiness on feet (R26.81);Other abnormalities of gait and mobility (R26.89);Muscle weakness (generalized) (M62.81)    Time: 0109-3235 PT Time Calculation (min) (ACUTE ONLY): 26 min   Charges:   PT Evaluation $PT Eval Moderate Complexity: 1 Mod PT Treatments $Gait Training: 8-22 mins        [facility] PT Acute Rehabilitation Services Pager [phone] Office [phone]  Angelina Ok [facility] 04/19/2021, 4:19 PM

## 2021-04-19 NOTE — Progress Notes (Signed)
   NAME:  Katherine Graves, MRN:  [id number], DOB:  January 13, 1961, LOS: 2 ADMISSION DATE:  04/16/2021, CONSULTATION DATE:  04/17/21 REFERRING MD:  R[facility]our - [facility], CHIEF COMPLAINT:  respiratory distress    History of Present Illness:  60 yo F PMH hypothyroidism, HTN, gout, patient reported asthma and CHF who presented to [facility] 11/18 with SOB.  Ultimately required intubation and found to be flu positive.  Pertinent  Medical History  Morbid obesity HTN Hypothyroidism Possible asthma Possible CHF  Significant Hospital Events: Including procedures, antibiotic start and stop dates in addition to other pertinent events   11/18 to ED with resp distress, hypoxia. Flu A positive. Developed hypercarbia 11/19 worse resp distress despite BiPAP. Worse hypercarbia and mentation. Started on heparin gtt from elevated trops. Intubated, transferred to [facility] ICU  11/20 wua sbt, extubation  Interim History / Subjective:  No events. Voice a bit raspy Thinks breathing is improved  Objective   Blood pressure (!) 152/83, pulse 81, temperature 97.6 F (36.4 C), temperature source Oral, resp. rate 16, height 5\' 8"  (1.727 m), weight 118.5 kg, SpO2 95 %.    Vent Mode: PSV;CPAP FiO2 (%):  [40 %] 40 %   Intake/Output Summary (Last 24 hours) at 04/19/2021 0804 Last data filed at 04/19/2021 0600 Gross per 24 hour  Intake 1152.69 ml  Output 2650 ml  Net -1497.31 ml    Filed Weights   04/16/21 2238 04/18/21 0500 04/19/21 0500  Weight: 125 kg 119.5 kg 118.5 kg    Examination: Constitutional: no distress  Eyes: EOMI, ?exopthalmos Ears, nose, mouth, and throat: MMM, malampatti 4 Cardiovascular: RRR, ext warm Respiratory: Wheezing worse on R, no accessory muscle use Gastrointestinal: Soft, +BS Skin: No rashes, normal turgor Neurologic: moves all 4 ext to command Psychiatric: RASS 0  CBC stable No BMP: ordered No new imaging  Resolved Hospital Problem list     Assessment & Plan:   Acute on chronic  hypoxemic respiratory failure and acute hypercarbic resp failure due to influenza pneumonia- extubated 11/20 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 CKD- cannot find baseline Cr in system Grade 3 obesity OSA recently started on CPAP HTN Elevated troponin without echo correlate c/w type 2 NSTEMI, does have risk factors for CAD however Hypothyroidm- TSH's are very different 11/18 to 11/19, would just repeat in 4-6 weeks as OP  - Start ASA/atorvastatin/BB, consider OP cardiac stress test once recovers from acute illness and baseline Cr established - OP TSH in ~4-6 weeks - CPAP qHS - Tamiflu - IS, progressive mobility - PT/OT - Remove foley, stable for transfer to tele, appreciate [facility] taking over starting 11/22  12/22 MD PCCM

## 2021-04-19 NOTE — Progress Notes (Signed)
Brief Nutrition Note  RD consulted for enteral nutrition initiation and management. Adult ICU Tube Feeding Protocol was ordered on 04/18/21. Pt extubated later in the day on 04/18/21 and is now on a Heart Healthy diet with 100% meal completion documented for dinner on 04/18/21. No further nutrition interventions planned at this time. Please re-consult as needed.    Mertie Clause, MS, RD, LDN Inpatient Clinical Dietitian Please see AMiON for contact information.

## 2021-04-19 NOTE — Plan of Care (Signed)
  Problem: Activity: Goal: Risk for activity intolerance will decrease Outcome: Progressing   Problem: Nutrition: Goal: Adequate nutrition will be maintained Outcome: Progressing   Problem: Coping: Goal: Level of anxiety will decrease Outcome: Progressing   

## 2021-04-20 LAB — GLUCOSE, CAPILLARY
Glucose-Capillary: 117 mg/dL — ABNORMAL HIGH (ref 70–99)
Glucose-Capillary: 119 mg/dL — ABNORMAL HIGH (ref 70–99)
Glucose-Capillary: 125 mg/dL — ABNORMAL HIGH (ref 70–99)
Glucose-Capillary: 182 mg/dL — ABNORMAL HIGH (ref 70–99)

## 2021-04-20 LAB — BASIC METABOLIC PANEL
Anion gap: 11 (ref 5–15)
BUN: 32 mg/dL — ABNORMAL HIGH (ref 8–23)
CO2: 28 mmol/L (ref 22–32)
Calcium: 8.4 mg/dL — ABNORMAL LOW (ref 8.9–10.3)
Chloride: 101 mmol/L (ref 98–111)
Creatinine, Ser: 1.58 mg/dL — ABNORMAL HIGH (ref 0.44–1.00)
GFR, Estimated: 37 mL/min — ABNORMAL LOW (ref >60)
Glucose, Bld: 96 mg/dL (ref 70–99)
Potassium: 4 mmol/L (ref 3.5–5.1)
Sodium: 140 mmol/L (ref 135–145)

## 2021-04-20 LAB — MAGNESIUM: Magnesium: 2.1 mg/dL (ref 1.7–2.4)

## 2021-04-20 LAB — CBC
HCT: 40.3 % (ref 36.0–46.0)
Hemoglobin: 11.6 g/dL — ABNORMAL LOW (ref 12.0–15.0)
MCH: 25.4 pg — ABNORMAL LOW (ref 26.0–34.0)
MCHC: 28.8 g/dL — ABNORMAL LOW (ref 30.0–36.0)
MCV: 88.2 fL (ref 80.0–100.0)
Platelets: 234 10*3/uL (ref 150–400)
RBC: 4.57 MIL/uL (ref 3.87–5.11)
RDW: 17.4 % — ABNORMAL HIGH (ref 11.5–15.5)
WBC: 6.3 10*3/uL (ref 4.0–10.5)
nRBC: 2.1 % — ABNORMAL HIGH (ref 0.0–0.2)

## 2021-04-20 LAB — LIPID PANEL
Cholesterol: 114 mg/dL (ref 0–200)
HDL: 32 mg/dL — ABNORMAL LOW (ref >40)
LDL Cholesterol: 62 mg/dL (ref 0–99)
Total CHOL/HDL Ratio: 3.6 RATIO
Triglycerides: 100 mg/dL (ref <150)
VLDL: 20 mg/dL (ref 0–40)

## 2021-04-20 MED ORDER — LEVOTHYROXINE SODIUM 25 MCG PO TABS
125.0000 ug | ORAL_TABLET | Freq: Every day | ORAL | Status: DC
Start: 2021-04-21 — End: 2021-04-21
  Administered 2021-04-21: 125 ug via ORAL
  Filled 2021-04-20: qty 1

## 2021-04-20 MED ORDER — OSELTAMIVIR PHOSPHATE 30 MG PO CAPS
30.0000 mg | ORAL_CAPSULE | Freq: Two times a day (BID) | ORAL | Status: DC
  Administered 2021-04-20 – 2021-04-21 (×2): 30 mg via ORAL
  Filled 2021-04-20 (×3): qty 1

## 2021-04-20 NOTE — Progress Notes (Signed)
Progress Note    Katherine Graves  HGD:[id number] DOB: May 12, 1961  DOA: 04/16/2021 PCP: Craig Staggers, MD    Brief Narrative:     Medical records reviewed and are as summarized below:  Katherine Graves is an 60 y.o. female PMH hypothyroidism, HTN, gout, patient reported asthma and CHF who presented to [facility] 11/18 with SOB.  Ultimately required intubation and found to be flu positive.  Assessment/Plan:   Principal Problem:   Acute respiratory failure with hypoxia (HCC) Active Problems:   Acute respiratory failure with hypoxia and hypercapnia (HCC)    Acute on chronic hypoxemic respiratory failure and acute hypercarbic resp failure due to influenza pneumonia - extubated 11/20 -on 2L [place] at home baseline -wean from 4L -tamiflu   pulmonary HTN-  -tx here is supplemental O2, euvolemia and CPAP use  ?AKI vs. CKD- - cannot find baseline Cr in system -trending down  OSA recently started on CPAP  HTN -controlled  Elevated troponin without echo correlate c/w type 2 NSTEMI -denies CP -can consider referral to [facility] outpatient   Hypothyroism- TSH's are very different 11/18 to 11/19, check free t4 and repeat in 4-6 weeks as OP  Morbid obesity Estimated body mass index is 38.61 kg/m as calculated from the following:   Height as of this encounter: 5\' 8"  (1.727 m).   Weight as of this encounter: 115.2 kg.    Family Communication/Anticipated D/C date and plan/Code Status   DVT prophylaxis: heparin Code Status: Full Code.  Disposition Plan: Status is: Inpatient  Remains inpatient appropriate because: need to wean O2 to home dose -from home, lives alone -PT recommends home health -home 24-48 hours?        Medical Consultants:   PCCM  Subjective:   Sleepy   Objective:    Vitals:   04/20/21 0407 04/20/21 0820 04/20/21 0833 04/20/21 1100  BP: (!) 142/76 137/71 (!) 143/68 120/69  Pulse: 76 78 79 70  Resp: 20  19 18   Temp: 97.9 F (36.6 C)  97.8 F  (36.6 C) 98.6 F (37 C)  TempSrc: Oral  Oral Oral  SpO2: 97%  100% 99%  Weight:      Height:        Intake/Output Summary (Last 24 hours) at 04/20/2021 1328 Last data filed at 04/20/2021 1318 Gross per 24 hour  Intake 637.34 ml  Output 600 ml  Net 37.34 ml   Filed Weights   04/19/21 0500 04/19/21 1602 04/20/21 0253  Weight: 118.5 kg 117.3 kg 115.2 kg    Exam:  General: Appearance:    Obese female in no acute distress- sitting in chair   ? Exophthalmos   Lungs:     On 4L [place], diminished, respirations unlabored  Heart:    Normal heart rate.    MS:   All extremities are intact.    Neurologic:   Awake, alert     Data Reviewed:   I have personally reviewed following labs and imaging studies:  Labs: Labs show the following:   Basic Metabolic Panel: Recent Labs  Lab 04/16/21 2226 04/17/21 1011 04/18/21 0149 04/18/21 0725 04/18/21 1628 04/19/21 0428 04/20/21 0246  NA 138 140 140  --   --  142 140  K 3.8 3.1* 3.9  --   --  3.7 4.0  CL 98 99 101  --   --  100 101  CO2 26 26 27   --   --  30 28  GLUCOSE 116* 127* 115*  --   --  91 96  BUN 38* 42* 40*  --   --  38* 32*  CREATININE 3.03* 2.46* 2.32*  --   --  1.86* 1.58*  CALCIUM 8.1* 8.4* 8.5*  --   --  8.3* 8.4*  MG  --   --  1.8 1.9 1.8 1.7 2.1  PHOS  --   --   --  4.6 3.3 4.2  --    GFR Estimated Creatinine Clearance: 49.8 mL/min (A) (by C-G formula based on SCr of 1.58 mg/dL (H)). Liver Function Tests: No results for input(s): AST, ALT, ALKPHOS, BILITOT, PROT, ALBUMIN in the last 168 hours. No results for input(s): LIPASE, AMYLASE in the last 168 hours. Recent Labs  Lab 04/17/21 0109  AMMONIA 26   Coagulation profile No results for input(s): INR, PROTIME in the last 168 hours.  CBC: Recent Labs  Lab 04/16/21 2226 04/17/21 1011 04/18/21 0149 04/19/21 0428 04/20/21 0246  WBC 7.3 7.0 11.7* 9.0 6.3  NEUTROABS 5.7 5.9  --   --   --   HGB 10.3* 11.4* 10.8* 12.0 11.6*  HCT 35.3* 37.8 35.7* 40.9 40.3   MCV 89.8 84.8 84.2 86.1 88.2  PLT 266 278 284 260 234   Cardiac Enzymes: No results for input(s): CKTOTAL, CKMB, CKMBINDEX, TROPONINI in the last 168 hours. BNP (last 3 results) No results for input(s): PROBNP in the last 8760 hours. CBG: Recent Labs  Lab 04/19/21 1700 04/20/21 0009 04/20/21 0405 04/20/21 0829 04/20/21 1058  GLUCAP 104* 119* 117* 182* 125*   D-Dimer: No results for input(s): DDIMER in the last 72 hours. Hgb A1c: No results for input(s): HGBA1C in the last 72 hours. Lipid Profile: Recent Labs    04/18/21 0148 04/20/21 0246  CHOL  --  114  HDL  --  32*  LDLCALC  --  62  TRIG 129 100  CHOLHDL  --  3.6   Thyroid function studies: Recent Labs    04/17/21 1453  TSH 0.848   Anemia work up: No results for input(s): VITAMINB12, FOLATE, FERRITIN, TIBC, IRON, RETICCTPCT in the last 72 hours. Sepsis Labs: Recent Labs  Lab 04/16/21 2226 04/17/21 0110 04/17/21 1011 04/18/21 0149 04/19/21 0428 04/20/21 0246  WBC 7.3  --  7.0 11.7* 9.0 6.3  LATICACIDVEN 2.6* 2.4*  --   --   --   --     Microbiology Recent Results (from the past 240 hour(s))  Blood culture (routine x 2)     Status: None (Preliminary result)   Collection Time: 04/16/21 10:30 PM   Specimen: Right Antecubital; Blood  Result Value Ref Range Status   Specimen Description   Final    RIGHT ANTECUBITAL BOTTLES DRAWN AEROBIC AND ANAEROBIC   Special Requests Blood Culture adequate volume  Final   Culture   Final    NO GROWTH 4 DAYS Performed at [facility], [address] [place], [place] [address]    Report Status PENDING  Incomplete  Resp Panel by RT-PCR (Flu A&B, Covid) Nasopharyngeal Swab     Status: Abnormal   Collection Time: 04/16/21 10:49 PM   Specimen: Nasopharyngeal Swab; Nasopharyngeal(NP) swabs in vial transport medium  Result Value Ref Range Status   SARS Coronavirus 2 by RT PCR NEGATIVE NEGATIVE Final    Comment: (NOTE) SARS-CoV-2 target nucleic acids are NOT  DETECTED.  The SARS-CoV-2 RNA is generally detectable in upper respiratory specimens during the acute phase of infection. The lowest concentration of SARS-CoV-2 viral copies this assay can detect is  138 copies/mL. A negative result does not preclude SARS-Cov-2 infection and should not be used as the sole basis for treatment or other patient management decisions. A negative result may occur with  improper specimen collection/handling, submission of specimen other than nasopharyngeal swab, presence of viral mutation(s) within the areas targeted by this assay, and inadequate number of viral copies(<138 copies/mL). A negative result must be combined with clinical observations, patient history, and epidemiological information. The expected result is Negative.  Fact Sheet for Patients:  BloggerCourse.com  Fact Sheet for Healthcare Providers:  SeriousBroker.it  This test is no t yet approved or cleared by the [place] FDA and  has been authorized for detection and/or diagnosis of SARS-CoV-2 by FDA under an Emergency Use Authorization (EUA). This EUA will remain  in effect (meaning this test can be used) for the duration of the COVID-19 declaration under Section 564(b)(1) of the Act, 21 U.S.C.section 360bbb-3(b)(1), unless the authorization is terminated  or revoked sooner.       Influenza A by PCR POSITIVE (A) NEGATIVE Final   Influenza B by PCR NEGATIVE NEGATIVE Final    Comment: (NOTE) The Xpert Xpress SARS-CoV-2/FLU/RSV plus assay is intended as an aid in the diagnosis of influenza from Nasopharyngeal swab specimens and should not be used as a sole basis for treatment. Nasal washings and aspirates are unacceptable for Xpert Xpress SARS-CoV-2/FLU/RSV testing.  Fact Sheet for Patients: BloggerCourse.com  Fact Sheet for Healthcare Providers: SeriousBroker.it  This test is not  yet approved or cleared by the [place] FDA and has been authorized for detection and/or diagnosis of SARS-CoV-2 by FDA under an Emergency Use Authorization (EUA). This EUA will remain in effect (meaning this test can be used) for the duration of the COVID-19 declaration under Section 564(b)(1) of the Act, 21 U.S.C. section 360bbb-3(b)(1), unless the authorization is terminated or revoked.  Performed at [facility], [address], [place], [place] [address]   Blood culture (routine x 2)     Status: None (Preliminary result)   Collection Time: 04/16/21 11:29 PM   Specimen: BLOOD LEFT HAND  Result Value Ref Range Status   Specimen Description   Final    BLOOD LEFT HAND BOTTLES DRAWN AEROBIC AND ANAEROBIC   Special Requests   Final    Blood Culture results may not be optimal due to an inadequate volume of blood received in culture bottles   Culture   Final    NO GROWTH 4 DAYS Performed at [facility], [address] [place], [place] [address]    Report Status PENDING  Incomplete    Procedures and diagnostic studies:  No results found.  Medications:    amLODipine  5 mg Per Tube Daily   aspirin EC  81 mg Oral Daily   atorvastatin  10 mg Oral Daily   carvedilol  3.125 mg Oral BID WC   Chlorhexidine Gluconate Cloth  6 each Topical Daily   docusate sodium  100 mg Oral BID   heparin injection (subcutaneous)  5,000 Units Subcutaneous Q8H   [START ON 04/21/2021] levothyroxine  125 mcg Oral QAC breakfast   oseltamivir  30 mg Oral BID   polyethylene glycol  17 g Oral Daily   Continuous Infusions:  lactated ringers 10 mL/hr at 04/19/21 0600     LOS: 3 days   Joseph Art  [facility]   How to contact the [facility] Attending or Consulting provider 7A - 7P or covering provider during after hours 7P -7A, for  this patient?  Check the care team in [facility] and look for a) attending/consulting [facility] provider listed and b) the [facility] team listed Log into [email or web address] and use [facility]'s universal password to access. If you do not have the password, please contact the hospital operator. Locate the [facility] provider you are looking for under [facility] and page to a number that you can be directly reached. If you still have difficulty reaching the provider, please page the [facility] (Director on Call) for the Hospitalists listed on amion for assistance.  04/20/2021, 1:28 PM

## 2021-04-20 NOTE — Progress Notes (Signed)
Physical Therapy Treatment Patient Details Name: Katherine Graves MRN: [id number] DOB: 03-18-1961 Today's Date: 04/20/2021   History of Present Illness Pt adm to [facility] on 11/18 with SOB and found to be flu+. Pt intubated 11/19 with respiratory distress and tranferred to [facility]. Extubated 11/20. PMH - morbid obesity, HTN, gout, CHF, hypothyroid.    PT Comments    Pt making good progress today.  She was on 4 L O2 at arrival but able to wean to 2 L (pt's home level).  She ambulated 60'x2 with seated rest break with cues and min guard.  Continue to progress as able.     Recommendations for follow up therapy are one component of a multi-disciplinary discharge planning process, led by the attending physician.  Recommendations may be updated based on patient status, additional functional criteria and insurance authorization.  Follow Up Recommendations  Home health PT     Assistance Recommended at Discharge Frequent or constant Supervision/Assistance (initially)  Equipment Recommendations  Rollator (4 wheels)    Recommendations for Other Services       Precautions / Restrictions Precautions Precautions: Fall;Other (comment) Precaution Comments: watch SpO2     Mobility  Bed Mobility               General bed mobility comments: In chair    Transfers Overall transfer level: Needs assistance Equipment used: Rollator (4 wheels) Transfers: Sit to/from Stand Sit to Stand: Min guard           General transfer comment: min guard for safety; recalled how to lock rollator brakes from OT session without cues, cues for hand placement and controlled descent; perforemed x 2    Ambulation/Gait Ambulation/Gait assistance: Min guard Gait Distance (Feet): 60 Feet (60'x2) Assistive device: Rollator (4 wheels) Gait Pattern/deviations: Decreased stride length;Step-through pattern Gait velocity: decr     General Gait Details: Mild unsteadiness requiring min guard; ambulated 60'x2 with  seated rest break ; fatigued easily but no major feeling of "weakness" today and BPs stable; made laps in room as pt on droplet precautions and no droplet mask available for pt   Stairs             Wheelchair Mobility    Modified Rankin (Stroke Patients Only)       Balance Overall balance assessment: Needs assistance Sitting-balance support: No upper extremity supported;Feet supported Sitting balance-Leahy Scale: Good     Standing balance support: Bilateral upper extremity supported;During functional activity;Reliant on assistive device for balance Standing balance-Leahy Scale: Poor Standing balance comment: requirign RW                            Cognition Arousal/Alertness: Awake/alert Behavior During Therapy: WFL for tasks assessed/performed Overall Cognitive Status: Within Functional Limits for tasks assessed                                 General Comments: .        Exercises      General Comments General comments (skin integrity, edema, etc.): Pt on 4 L with sats 100%.  Decreased to 3 L and still 100 % rest, ambulated 3 L and 99%.  Decreased to 2 L and sats 97% rest and 95% activity.  Left on 2 L and notified RN.  Checked BP in sitting and standing and stable - no weak feeling today      Pertinent  Vitals/Pain Pain Assessment: No/denies pain    Home Living                          Prior Function            PT Goals (current goals can now be found in the care plan section) Progress towards PT goals: Progressing toward goals    Frequency    Min 3X/week      PT Plan Current plan remains appropriate    Co-evaluation              AM-PAC PT "6 Clicks" Mobility   Outcome Measure  Help needed turning from your back to your side while in a flat bed without using bedrails?: None Help needed moving from lying on your back to sitting on the side of a flat bed without using bedrails?: A Little Help needed  moving to and from a bed to a chair (including a wheelchair)?: A Little Help needed standing up from a chair using your arms (e.g., wheelchair or bedside chair)?: A Little Help needed to walk in hospital room?: A Little Help needed climbing 3-5 steps with a railing? : A Lot 6 Click Score: 18    End of Session Equipment Utilized During Treatment: Oxygen Activity Tolerance: Patient tolerated treatment well Patient left: with chair alarm set;in chair;with call bell/phone within reach Nurse Communication: Mobility status;Other (comment) (decreased to 2 L O2) PT Visit Diagnosis: Unsteadiness on feet (R26.81);Other abnormalities of gait and mobility (R26.89);Muscle weakness (generalized) (M62.81)     Time: 1206-1228 PT Time Calculation (min) (ACUTE ONLY): 22 min  Charges:  $Gait Training: 8-22 mins                     Anise Salvo, PT Acute Rehab Services Pager [phone] [facility] [phone]    Katherine Graves 04/20/2021, 1:05 PM

## 2021-04-20 NOTE — Progress Notes (Signed)
Pt refused cpap for tonight 

## 2021-04-20 NOTE — Plan of Care (Signed)
  Problem: Education: Goal: Knowledge of General Education information will improve Description Including pain rating scale, medication(s)/side effects and non-pharmacologic comfort measures Outcome: Progressing   Problem: Clinical Measurements: Goal: Ability to maintain clinical measurements within normal limits will improve Outcome: Progressing   Problem: Activity: Goal: Risk for activity intolerance will decrease Outcome: Progressing   

## 2021-04-20 NOTE — Evaluation (Signed)
Occupational Therapy Evaluation Patient Details Name: Katherine Graves MRN: [id number] DOB: 1961/01/23 Today's Date: 04/20/2021   History of Present Illness Pt adm to [facility] on 11/18 with SOB and found to be flu+. Pt intubated 11/19 with respiratory distress and tranferred to [facility]. Extubated 11/20. PMH - morbid obesity, HTN, gout, CHF, hypothyroid.   Clinical Impression   PTA patient was living alone in a private residence and was grossly I with ADLs/IADLs without AD. 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 Patient currently functioning below baseline demonstrating observed ADLs including grooming standing at sink level with Min A grossly and use of AD. Patient also limited by deficits listed below including decreased cardiopulmonary status requiring supplemental O2, decreased activity tolerance and balance deficits and would benefit from continued acute OT services in prep for safe d/c home. Patient reports having a friend that can assist her upon d/c home. OT will continue to follow acutely.       Recommendations for follow up therapy are one component of a multi-disciplinary discharge planning process, led by the attending physician.  Recommendations may be updated based on patient status, additional functional criteria and insurance authorization.   Follow Up Recommendations  Home health OT    Assistance Recommended at Discharge Frequent or constant Supervision/Assistance (Initial frequent/constant supervision/assist)  Functional Status Assessment  Patient has had a recent decline in their functional status and demonstrates the ability to make significant improvements in function in a reasonable and predictable amount of time.  Equipment Recommendations  BSC/3in1    Recommendations for Other Services       Precautions / Restrictions Precautions Precautions: Fall;Other (comment) Precaution Comments: watch SpO2 Restrictions Weight Bearing Restrictions: No       Mobility Bed Mobility Overal bed mobility: Needs Assistance Bed Mobility: Supine to Sit     Supine to sit: Supervision     General bed mobility comments: HOB elevated and sueprvision A for safety.    Transfers Overall transfer level: Needs assistance Equipment used: Rollator (4 wheels) Transfers: Sit to/from Stand Sit to Stand: Min assist           General transfer comment: Min A for sit to stand from EOB positioned in lowest setting. Cues for hand placement.      Balance Overall balance assessment: Needs assistance Sitting-balance support: No upper extremity supported;Feet supported Sitting balance-Leahy Scale: Fair     Standing balance support: Bilateral upper extremity supported;During functional activity Standing balance-Leahy Scale: Poor Standing balance comment: UE support and min to min guard assist for static standing                           ADL either performed or assessed with clinical judgement   ADL Overall ADL's : Needs assistance/impaired Eating/Feeding: Independent;Sitting   Grooming: Minimal assistance;Standing Grooming Details (indicate cue type and reason): Min A for steadying/balance. Upper Body Bathing: Set up;Sitting   Lower Body Bathing: Minimal assistance;Sit to/from stand   Upper Body Dressing : Set up;Sitting   Lower Body Dressing: Minimal assistance;Sit to/from stand   Toilet Transfer: Minimal assistance Toilet Transfer Details (indicate cue type and reason): Simulated with transfer from sink to recliner with use of rollator.                 Vision Baseline Vision/History: 0 No visual deficits Ability to See in Adequate Light: 0 Adequate Patient Visual Report: No change from baseline Vision Assessment?: No apparent visual deficits Additional Comments:  Able to correctly identify time on wall clock. Exophthalmos bilaterally.     Perception     Praxis      Pertinent Vitals/Pain Pain Assessment: No/denies  pain     Hand Dominance Right   Extremity/Trunk Assessment Upper Extremity Assessment Upper Extremity Assessment: Generalized weakness   Lower Extremity Assessment Lower Extremity Assessment: Defer to PT evaluation   Cervical / Trunk Assessment Cervical / Trunk Assessment: Normal   Communication Communication Communication: No difficulties   Cognition Arousal/Alertness: Lethargic;Awake/alert (Lethargic initially but alertness improved with mobility) Behavior During Therapy: WFL for tasks assessed/performed Overall Cognitive Status: Within Functional Limits for tasks assessed                                 General Comments: Lethargy limiting ability to answer orientation questions.     General Comments  SpO2 100% on 4L O2 upon entry. Desat to 74% after 2-3 min on RA. Returned to 4L with SpO2 improving to >97%.    Exercises     Shoulder Instructions      Home Living Family/patient expects to be discharged to:: Private residence Living Arrangements: Alone Available Help at Discharge: Family Type of Home: House Home Access: Stairs to enter Secretary/administrator of Steps: 4 Entrance Stairs-Rails: Right Home Layout: One level               Home Equipment: None          Prior Functioning/Environment Prior Level of Function : Independent/Modified Independent             Mobility Comments: No assistive device          OT Problem List: Decreased strength;Decreased activity tolerance;Impaired balance (sitting and/or standing);Decreased safety awareness;Decreased knowledge of use of DME or AE;Cardiopulmonary status limiting activity      OT Treatment/Interventions: Self-care/ADL training;Therapeutic exercise;Energy conservation;DME and/or AE instruction;Therapeutic activities;Patient/family education;Balance training    OT Goals(Current goals can be found in the care plan section) Acute Rehab OT Goals Patient Stated Goal: To get  better. OT Goal Formulation: With patient Time For Goal Achievement: 05/04/21 Potential to Achieve Goals: Good ADL Goals Pt Will Perform Grooming: with modified independence;standing Pt Will Perform Upper Body Dressing: Independently Pt Will Perform Lower Body Dressing: with modified independence;sit to/from stand Pt Will Transfer to Toilet: with modified independence;ambulating Pt Will Perform Toileting - Clothing Manipulation and hygiene: with modified independence;sit to/from stand Pt Will Perform Tub/Shower Transfer: Tub transfer;Shower transfer;3 in 1 Additional ADL Goal #1: Patient will tolerate 15 min of therapeutic activity without need for rest break and SpO2 >90% on 2L O2 indicating improved activity tolernace.  OT Frequency: Min 2X/week   Barriers to D/C: Decreased caregiver support  Lives alone       Co-evaluation              AM-PAC OT "6 Clicks" Daily Activity     Outcome Measure Help from another person eating meals?: None Help from another person taking care of personal grooming?: A Little Help from another person toileting, which includes using toliet, bedpan, or urinal?: A Little Help from another person bathing (including washing, rinsing, drying)?: A Little Help from another person to put on and taking off regular upper body clothing?: A Little Help from another person to put on and taking off regular lower body clothing?: A Little 6 Click Score: 19   End of Session Equipment Utilized During Treatment: Gait belt;Rollator (4  wheels) Nurse Communication: Mobility status  Activity Tolerance: Patient tolerated treatment well;Patient limited by lethargy Patient left: in chair;with call bell/phone within reach;with chair alarm set  OT Visit Diagnosis: Unsteadiness on feet (R26.81);Muscle weakness (generalized) (M62.81)                Time: 9390-3009 OT Time Calculation (min): 26 min Charges:  OT General Charges $OT Visit: 1 Visit OT Evaluation $OT Eval  Moderate Complexity: 1 Mod OT Treatments $Self Care/Home Management : 8-22 mins  Karole Oo H. OTR/L Supplemental OT, Department of rehab services [phone]  Maksim Peregoy R H. 04/20/2021, 8:04 AM

## 2021-04-20 NOTE — Progress Notes (Signed)
Pt refused to wear CPAP. RT set up pt on cpap and once we got it on she stated she cant wear it.

## 2021-04-21 DIAGNOSIS — I10 Essential (primary) hypertension: Secondary | ICD-10-CM

## 2021-04-21 DIAGNOSIS — N1831 Chronic kidney disease, stage 3a: Secondary | ICD-10-CM

## 2021-04-21 DIAGNOSIS — J101 Influenza due to other identified influenza virus with other respiratory manifestations: Secondary | ICD-10-CM

## 2021-04-21 DIAGNOSIS — N179 Acute kidney failure, unspecified: Secondary | ICD-10-CM

## 2021-04-21 LAB — BASIC METABOLIC PANEL
Anion gap: 8 (ref 5–15)
BUN: 23 mg/dL (ref 8–23)
CO2: 34 mmol/L — ABNORMAL HIGH (ref 22–32)
Calcium: 8.7 mg/dL — ABNORMAL LOW (ref 8.9–10.3)
Chloride: 100 mmol/L (ref 98–111)
Creatinine, Ser: 1.4 mg/dL — ABNORMAL HIGH (ref 0.44–1.00)
GFR, Estimated: 43 mL/min — ABNORMAL LOW (ref >60)
Glucose, Bld: 101 mg/dL — ABNORMAL HIGH (ref 70–99)
Potassium: 4 mmol/L (ref 3.5–5.1)
Sodium: 142 mmol/L (ref 135–145)

## 2021-04-21 LAB — CULTURE, BLOOD (ROUTINE X 2)
Culture: NO GROWTH
Culture: NO GROWTH
Special Requests: ADEQUATE

## 2021-04-21 LAB — GLUCOSE, CAPILLARY: Glucose-Capillary: 121 mg/dL — ABNORMAL HIGH (ref 70–99)

## 2021-04-21 LAB — CBC
HCT: 39.3 % (ref 36.0–46.0)
Hemoglobin: 11.3 g/dL — ABNORMAL LOW (ref 12.0–15.0)
MCH: 24.9 pg — ABNORMAL LOW (ref 26.0–34.0)
MCHC: 28.8 g/dL — ABNORMAL LOW (ref 30.0–36.0)
MCV: 86.6 fL (ref 80.0–100.0)
Platelets: 208 10*3/uL (ref 150–400)
RBC: 4.54 MIL/uL (ref 3.87–5.11)
RDW: 17.3 % — ABNORMAL HIGH (ref 11.5–15.5)
WBC: 6.6 10*3/uL (ref 4.0–10.5)
nRBC: 0.9 % — ABNORMAL HIGH (ref 0.0–0.2)

## 2021-04-21 LAB — MAGNESIUM: Magnesium: 1.7 mg/dL (ref 1.7–2.4)

## 2021-04-21 LAB — T4, FREE: Free T4: 1.01 ng/dL (ref 0.61–1.12)

## 2021-04-21 MED ORDER — OSELTAMIVIR PHOSPHATE 30 MG PO CAPS
30.0000 mg | ORAL_CAPSULE | Freq: Two times a day (BID) | ORAL | 0 refills | Status: AC
Start: 2021-04-21 — End: 2021-04-24

## 2021-04-21 MED ORDER — CARVEDILOL 3.125 MG PO TABS: 3.1250 mg | ORAL_TABLET | Freq: Two times a day (BID) | ORAL | 0 refills | Status: AC

## 2021-04-21 NOTE — Progress Notes (Signed)
Mobility Specialist Progress Note:   04/21/21 1403  Mobility  Activity Ambulated to bathroom  Level of Assistance Standby assist, set-up cues, supervision of patient - no hands on  Assistive Device Four wheel walker  Distance Ambulated (ft) 20 ft  Mobility Out of bed for toileting  Mobility Response Tolerated well  Mobility performed by Mobility specialist  Bed Position Chair  $Mobility charge 1 Mobility   [facility] Kirstine Jacquin Mobility Specialist Primary Phone [phone] Secondary Phone [phone]

## 2021-04-21 NOTE — Progress Notes (Signed)
Occupational Therapy Treatment Patient Details Name: Katherine Graves MRN: [id number] DOB: Oct 09, 1960 Today's Date: 04/21/2021   History of present illness Pt adm to [facility] on 11/18 with SOB and found to be flu+. Pt intubated 11/19 with respiratory distress and tranferred to [facility]. Extubated 11/20. PMH - morbid obesity, HTN, gout, CHF, hypothyroid.   OT comments  Pt progressing towards OT goals with session focusing on LB ADLs with pt able to complete with Setup Assist and O2 levels with activity. Pt able to mobilize in room without AD on 2 L O2 though noted with intermittent LOB with min guard to correct. Spo2 > 90% on baseline 2 L O2. Education re: safe Rollator use (recommend use inside/outside of home), BSC use over toilet and/or as shower chair, activity tolerance progression and energy conservation strategies (handout provided). Pt verbalizes understanding of education and eager to return home today. Continue to rec HHOT and BSC at DC.    Recommendations for follow up therapy are one component of a multi-disciplinary discharge planning process, led by the attending physician.  Recommendations may be updated based on patient status, additional functional criteria and insurance authorization.    Follow Up Recommendations  Home health OT    Assistance Recommended at Discharge Intermittent Supervision/Assistance  Equipment Recommendations  BSC/3in1    Recommendations for Other Services      Precautions / Restrictions Precautions Precautions: Fall;Other (comment) Precaution Comments: watch SpO2 (baseline 2 L O2) Restrictions Weight Bearing Restrictions: No       Mobility Bed Mobility               General bed mobility comments: In chair    Transfers Overall transfer level: Needs assistance Equipment used: None Transfers: Sit to/from Stand Sit to Stand: Supervision           General transfer comment: no assist to stand, noted limitations with dynamic tasks without AD      Balance Overall balance assessment: Needs assistance Sitting-balance support: No upper extremity supported;Feet supported Sitting balance-Leahy Scale: Good     Standing balance support: No upper extremity supported;During functional activity Standing balance-Leahy Scale: Fair Standing balance comment: fair static standing, minor LOB without DME for mobility in room with min guard to correct                           ADL either performed or assessed with clinical judgement   ADL Overall ADL's : Needs assistance/impaired                     Lower Body Dressing: Set up;Sit to/from stand Lower Body Dressing Details (indicate cue type and reason): to don underwear. assist to retrieve items though no assist needed to don clothing             Functional mobility during ADLs: Min guard General ADL Comments: Session focused on assessment of LB dressing, O2 with activity pending DC, and education on energy conservation strategies. Pt noted with LOB without DME use; Rollator delivered to room - educated pt on brake use, pushing against wall before sitting for extra security and to implement energy conservation strategies. Educated on use of BSC over toilet and/or as shower chair pending needs at DC    Extremity/Trunk Assessment Upper Extremity Assessment Upper Extremity Assessment: Generalized weakness   Lower Extremity Assessment Lower Extremity Assessment: Defer to PT evaluation        Vision   Vision Assessment?: No apparent  visual deficits   Perception     Praxis      Cognition Arousal/Alertness: Awake/alert Behavior During Therapy: WFL for tasks assessed/performed Overall Cognitive Status: Within Functional Limits for tasks assessed                                            Exercises     Shoulder Instructions       General Comments Spo2 >90% on 2 LO2 (baseline per pt)    Pertinent Vitals/ Pain       Pain Assessment:  No/denies pain  Home Living                                          Prior Functioning/Environment              Frequency  Min 2X/week        Progress Toward Goals  OT Goals(current goals can now be found in the care plan section)  Progress towards OT goals: Progressing toward goals  Acute Rehab OT Goals Patient Stated Goal: go home today, continue recovery OT Goal Formulation: With patient Time For Goal Achievement: 05/04/21 Potential to Achieve Goals: Good ADL Goals Pt Will Perform Grooming: with modified independence;standing Pt Will Perform Upper Body Dressing: Independently Pt Will Perform Lower Body Dressing: with modified independence;sit to/from stand Pt Will Transfer to Toilet: with modified independence;ambulating Pt Will Perform Toileting - Clothing Manipulation and hygiene: with modified independence;sit to/from stand Pt Will Perform Tub/Shower Transfer: Tub transfer;Shower transfer;3 in 1 Additional ADL Goal #1: Patient will tolerate 15 min of therapeutic activity without need for rest break and SpO2 >90% on 2L O2 indicating improved activity tolernace.  Plan Discharge plan remains appropriate    Co-evaluation                 AM-PAC OT "6 Clicks" Daily Activity     Outcome Measure   Help from another person eating meals?: None Help from another person taking care of personal grooming?: A Little Help from another person toileting, which includes using toliet, bedpan, or urinal?: A Little Help from another person bathing (including washing, rinsing, drying)?: A Little Help from another person to put on and taking off regular upper body clothing?: A Little Help from another person to put on and taking off regular lower body clothing?: A Little 6 Click Score: 19    End of Session Equipment Utilized During Treatment: Oxygen  OT Visit Diagnosis: Unsteadiness on feet (R26.81);Muscle weakness (generalized) (M62.81)   Activity  Tolerance Patient tolerated treatment well   Patient Left in chair;with call bell/phone within reach   Nurse Communication Mobility status;Other (comment) (O2)        Time: 1157-2620 OT Time Calculation (min): 16 min  Charges: OT General Charges $OT Visit: 1 Visit OT Treatments $Self Care/Home Management : 8-22 mins  Bradd Canary, OTR/L Acute Rehab Services Office: [phone]   Lorre Munroe 04/21/2021, 12:45 PM

## 2021-04-21 NOTE — TOC Transition Note (Signed)
Transition of Care [facility]) - CM/SW Discharge Note   Patient Details  Name: Katherine Graves MRN: [id number] Date of Birth: 1961-01-09  [facility] of [facility] CM/SW Contact:  Leone Haven, RN Phone Number: 04/21/2021, 9:50 AM   Clinical Narrative:    Patient for dc home today, NCM offered choice for HHPT, HHOT, patient states she does not think she needs this and does not want this NCM to set this up.  She states she will need rollator and 3 n 1 , she does not have preference of DME company.  NCM made referral to [facility] with [employer].  Patient states her spouse will transport her home today, and she has no issues with medications.    Final next level of care: Home w Home Health Services Barriers to Discharge: No Barriers Identified   Patient Goals and CMS Choice Patient states their goals for this hospitalization and ongoing recovery are:: return home with spouse CMS Medicare.gov Compare Post Acute Care list provided to:: Patient Choice offered to / list presented to : Patient  Discharge Placement                       Discharge Plan and Services                DME Arranged: Walker rolling with seat, 3-N-1 DME Agency: [employer] Date DME Agency Contacted: 04/21/21 Time DME Agency Contacted: [phone] Representative spoke with at DME Agency: Silvio Pate HH Arranged: Refused HH          Social Determinants of Health (SDOH) Interventions     Readmission Risk Interventions No flowsheet data found.

## 2021-04-21 NOTE — Discharge Summary (Addendum)
Physician Discharge Summary  Katherine Graves [id number] DOB: Mar 02, 1961 DOA: 04/16/2021  PCP: Dairl Ponder, MD  Admit date: 04/16/2021 Discharge date: 04/21/2021 30 Day Unplanned Readmission Risk Score    Flowsheet Row ED to Hosp-Admission (Current) from 04/16/2021 in [place] [facility]  30 Day Unplanned Readmission Risk Score (%) 10.4 Filed at 04/21/2021 0801       This score is the patient's risk of an unplanned readmission within 30 days of being discharged (0 -100%). The score is based on dignosis, age, lab data, medications, orders, and past utilization.   Low:  0-14.9   Medium: 15-21.9   High: 22-29.9   Extreme: 30 and above          Admitted From: Home Disposition: Home  Recommendations for Outpatient Follow-up:  Follow up with PCP in 1-2 weeks Please obtain BMP/CBC in one week Please follow up with your PCP on the following pending results: Unresulted Labs (From admission, onward)     Start     Ordered   04/20/21 [id number]  Basic metabolic panel  Daily,   R     Question:  Specimen collection method  Answer:  Lab=Lab collect   04/19/21 0811   04/20/21 0500  Magnesium  Daily,   R     Question:  Specimen collection method  Answer:  Lab=Lab collect   04/19/21 0811   04/18/21 0500  CBC  Daily,   R      04/17/21 0406              Home Health: None, patient declined home health PT OT set up. Equipment/Devices: Rollator and 3 and 1  Discharge Condition: Stable CODE STATUS: Full code Diet recommendation: Cardiac  Subjective: Seen and examined.  No complaints.  No shortness of breath.  Not on any oxygen.  Wants to go home today.  Brief/Interim Summary: Katherine Graves is an 60 y.o. female PMH hypothyroidism, HTN, gout, patient reported asthma and CHF who presented to [facility] 11/18 with shortness of breath.  Ultimately required intubation and found to be flu positive.  Admitted under PCCM, subsequently extubated on 04/18/2021 and transferred under [facility].   Was weaned to 2 L and now on room air.  Was started on Tamiflu.  She also came in with significantly elevated creatinine of 3.03 which has improved to 1.40 now.  No prior creatinine available in the chart so unable to determine her baseline but presumably CKD stage IIIa is her baseline and she is very close to her baseline now.  Electrolytes are stable.  Blood pressure was also elevated which is now controlled.  She had elevated troponin but echo did not show wall motion abnormality.  Likely demand ischemia.  She was assessed by PT OT who recommended home health PT OT and DME.  However patient declined home health PT OT.  She is a stable and wants to go home so she is being discharged.  Patient's Toprol-XL was switched to carvedilol in order to better control her blood pressure and she is being discharged on carvedilol instead of Toprol-XL.  Discharge Diagnoses:  Principal Problem:   Acute respiratory failure with hypoxia (HCC) Active Problems:   Acute respiratory failure with hypoxia and hypercapnia (HCC)   Influenza A   Essential hypertension   Acute renal failure superimposed on stage 3a chronic kidney disease ([place])    Discharge Instructions   Allergies as of 04/21/2021   No Known Allergies      Medication List  STOP taking these medications    metoprolol succinate 100 MG 24 hr tablet Commonly known as: TOPROL-XL   naproxen 500 MG tablet Commonly known as: NAPROSYN       TAKE these medications    allopurinol 100 MG tablet Commonly known as: ZYLOPRIM Take 100 mg by mouth daily.   amLODipine-benazepril 5-40 MG capsule Commonly known as: LOTREL Take 1 capsule by mouth daily.   atorvastatin 10 MG tablet Commonly known as: LIPITOR Take 10 mg by mouth daily.   carvedilol 3.125 MG tablet Commonly known as: COREG Take 1 tablet (3.125 mg total) by mouth 2 (two) times daily with a meal.   furosemide 20 MG tablet Commonly known as: LASIX Take 20 mg by mouth daily.    hydrALAZINE 50 MG tablet Commonly known as: APRESOLINE Take 50 mg by mouth in the morning and at bedtime.   levothyroxine 125 MCG tablet Commonly known as: SYNTHROID Take 125 mcg by mouth daily before breakfast.   oseltamivir 30 MG capsule Commonly known as: TAMIFLU Take 1 capsule (30 mg total) by mouth 2 (two) times daily for 3 days.               Durable Medical Equipment  (From admission, onward)           Start     Ordered   04/21/21 1029  For home use only DME Bedside commode  Once       Question:  Patient needs a bedside commode to treat with the following condition  Answer:  Balance disorder   04/21/21 1028   04/21/21 1028  For home use only DME Walker rolling  Once       Question Answer Comment  Walker: With [place] Wheels   Patient needs a walker to treat with the following condition Balance disorder      04/21/21 1028   04/21/21 0949  For home use only DME 4 wheeled rolling walker with seat  Once       Question:  Patient needs a walker to treat with the following condition  Answer:  Weakness   04/21/21 0948   04/21/21 0949  For home use only DME 3 n 1  Once        04/21/21 [id number]            Follow-up Information     Dairl Ponder, MD. Go on 04/26/2021.   Specialty: Internal Medicine Why: @10 :45pm Contact information: [place] [phone]                No Known Allergies  Consultations: None   Procedures/Studies: CT Head Wo Contrast  Result Date: 04/17/2021 CLINICAL DATA:  Delirium EXAM: CT HEAD WITHOUT CONTRAST TECHNIQUE: Contiguous axial images were obtained from the base of the skull through the vertex without intravenous contrast. COMPARISON:  None. FINDINGS: Brain: Normal anatomic configuration. Parenchymal volume loss is commensurate with the patient's age. Moderate periventricular white matter changes are present likely reflecting the sequela of small vessel ischemia. No abnormal intra or  extra-axial mass lesion or fluid collection. No abnormal mass effect or midline shift. No evidence of acute intracranial hemorrhage or infarct. Ventricular size is normal. Cerebellum unremarkable. Vascular: No asymmetric hyperdense vasculature at the skull base. Skull: Intact Sinuses/Orbits: There is bilateral exophthalmos with hypertrophy extraocular musculature as well as the retro-orbital fat in keeping with Graves ophthalmopathy. The paranasal sinuses are clear. Other: Mastoid air cells and middle ear cavities are clear. 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 Correlation with thyroid function tests is recommended. Moderate periventricular white matter changes likely reflecting the sequela of small vessel ischemia, slightly advanced in relation of the patient's age. No acute intracranial abnormality. Electronically Signed   By: Fidela Salisbury M.D.   On: 04/17/2021 03:50   DG Chest Port 1 View  Result Date: 04/17/2021 CLINICAL DATA:  Acute respiratory failure, intubated EXAM: PORTABLE CHEST 1 VIEW COMPARISON:  04/17/2021 at 2:14 a.m. FINDINGS: Single frontal view of the chest demonstrates endotracheal tube overlying tracheal air column tip at level of thoracic inlet. Enteric catheter passes below diaphragm tip excluded by collimation. Cardiac silhouette is enlarged but stable. Stable prominence of the aortic arch again noted, with possible displacement of the calcified plaque from the lateral margin of the aortic wall. If there is concern for underlying aneurysm or dissection, CT angiography could be performed. Stable central vascular congestion without airspace disease, effusion, or pneumothorax. No acute bony abnormalities. IMPRESSION: 1. No complication after intubation. 2. Stable cardiomegaly as well as enlargement of the aortic arch. If there is concern for underlying aortic aneurysm or dissection, CT  angiography may be useful. 3. Stable central vascular congestion without overt edema. Critical Value/emergent results were called by telephone at the time of interpretation on 04/17/2021 at 3:57 Pm to provider [facility], who verbally acknowledged these results. Electronically Signed   By: Randa Ngo M.D.   On: 04/17/2021 16:05   DG Chest Portable 1 View  Result Date: 04/17/2021 CLINICAL DATA:  Intubation EXAM: PORTABLE CHEST 1 VIEW COMPARISON:  04/16/2021 FINDINGS: Support Apparatus: --Endotracheal tube: Tip just below the level of the clavicular heads. --Enteric tube:Tip and sideport project over the stomach. --Vascular catheter(s):None --Other: None The heart size and mediastinal contours are within normal limits. The lungs are clear. No pleural effusion or pneumothorax. IMPRESSION: No active disease. Electronically Signed   By: Ulyses Jarred M.D.   On: 04/17/2021 02:24   DG Chest Portable 1 View  Result Date: 04/16/2021 CLINICAL DATA:  Dyspnea EXAM: PORTABLE CHEST 1 VIEW COMPARISON:  None FINDINGS: The lungs are symmetrically expanded. There are mild bilateral perihilar interstitial pulmonary infiltrates which may reflect changes of airway inflammation or trace perihilar pulmonary edema. No pneumothorax or pleural effusion. Mild cardiomegaly. No acute bone abnormality. IMPRESSION: Mild bilateral perihilar interstitial pulmonary infiltrate possibly reflecting changes of airway inflammation, as can be seen with atypical infection, or trace perihilar pulmonary edema, possibly cardiogenic in nature. Mild cardiomegaly Electronically Signed   By: Fidela Salisbury M.D.   On: 04/16/2021 23:01   CT ANGIO CHEST AORTA W/CM & OR WO/CM  Result Date: 04/18/2021 CLINICAL DATA:  59 year old female with a history of nontraumatic aortic disease EXAM: CT ANGIOGRAPHY CHEST WITH CONTRAST TECHNIQUE: Multidetector CT imaging of the chest was performed using the standard protocol during bolus administration of  intravenous contrast. Multiplanar CT image reconstructions and MIPs were obtained to evaluate the vascular anatomy. CONTRAST:  132mL OMNIPAQUE IOHEXOL 350 MG/ML SOLN COMPARISON:  No prior CT FINDINGS: Cardiovascular: Heart: Cardiomegaly. Trace pericardial fluid/thickening. Coronary calcifications of left main, left anterior descending, right coronary arteries. Aorta: Minimal aortic valve calcifications. Greatest estimated diameter of the aortic annulus 24 mm on the coronal reformatted images. Greatest estimated diameter of the sino-tubular junction, 27 mm on the coronal images. Greatest estimated diameter of the ascending aorta on the axial images, 31 mm. Mild atherosclerotic changes of the aorta. Branch vessels are patent with a  3 vessel arch. Cervical cerebral vessels patent at the base of the neck. No pedunculated plaque, ulcerated plaque, dissection, periaortic fluid. No wall thickening. Pulmonary arteries: The correlate for findings on prior plain film of the enlarged left heart border is the main pulmonary artery diameter which is enlarged. Diameter of the main pulmonary artery estimated 4.5 cm. The timing of the contrast bolus is not optimized for the most sensitive evaluation of filling defects, however, there are no proximal filling defects identified. The pulmonary arteries are somewhat enlarged at the periphery of the lungs compared to the accompanying bronchi. Mediastinum/Nodes: Multiple mediastinal lymph nodes. Lymph nodes throughout all nodal station, borderline enlarged. Right hilar lymph nodes and subcarinal lymph nodes borderline enlarged. Unremarkable appearance of the thoracic esophagus. Unremarkable appearance of the thoracic inlet. Diffuse tracheal wall thickening without focal debris. Lungs/Pleura: Geographic ground-glass opacification of the upper lungs and lower lungs. No pneumothorax or pleural effusion. In addition diffuse tracheal wall thickening there is diffuse thickening of the left and  right mainstem bronchi with associated relative narrowing. The narrowing continues into the segmental bronchi of the bilateral lungs, including upper and lower lobes, with some associated partial obstructions. No interlobular septal thickening or thickening of the fissures. Upper Abdomen: No acute finding of the upper abdomen. Musculoskeletal: No acute displaced fracture. Degenerative changes of the spine. Review of the MIP images confirms the above findings. IMPRESSION: CT angiogram negative for acute arterial abnormality, and negative for aortic aneurysm. The correlate of the prior plain film is enlargement of the main pulmonary artery, which measures 4.5 cm. Additionally there is relative enlargement of the pulmonary arteries towards the periphery of the lungs, suggesting pulmonary artery hypertension. Diffuse tracheal wall thickening and associated thickening of the mainstem bronchi bilaterally and proximal segmental bronchi bilaterally. Favored etiology is infection/inflammatory such as bronchitis, however, diffuse tracheitis can be caused by atypical infection including tuberculous. Additional considerations though less likely include sarcoid, polychondritis, or amyloidosis. Inflammatory changes of the mainstem of the trachea and the bilateral segmental bronchi result in airway narrowing and multiple occlusions, manifested as airway trapping/geographic ground-glass of the bilateral lower lobes. Multiple mediastinal lymph nodes are presumably reactive, though could be seen with granulomatous disease such as sarcoid. Aortic atherosclerosis with coronary artery disease. Aortic Atherosclerosis (ICD10-I70.0). Signed, Dulcy Fanny. Dellia Nims, RPVI [facility] Electronically Signed   By: Corrie Mckusick D.O.   On: 04/18/2021 15:00   ECHOCARDIOGRAM LIMITED  Result Date: 04/17/2021    ECHOCARDIOGRAM LIMITED REPORT   Patient Name:   Katherine Graves Date of Exam:  04/17/2021 Medical Rec #:  [id number]     Height:       68.0 in Accession #:    [id number]    Weight:       275.6 lb Date of Birth:  1960-07-29    BSA:          2.342 m Patient Age:    49 years      BP:           151/88 mmHg Patient Gender: F             HR:           71 bpm. Exam Location:  Inpatient Procedure: Limited Echo, Cardiac Doppler and Color Doppler Indications:    CHF  History:        Patient has no prior history of Echocardiogram examinations.  Risk Factors:Morbid obesity. Respiratory failure requiring                 mechanical ventilation.  Sonographer:    Merrie Roof RDCS Referring Phys: [id number] [place]  1. Left ventricular ejection fraction, by estimation, is 65 to 70%. The left ventricle has normal function. There is moderate concentric left ventricular hypertrophy. Left ventricular diastolic parameters are consistent with Grade I diastolic dysfunction (impaired relaxation). Elevated left atrial pressure.  2. Right ventricular systolic function is low normal. The right ventricular size is mildly enlarged.  3. Right atrial size was mild to moderately dilated.  4. A small pericardial effusion is present.  5. The mitral valve is normal in structure. Trivial mitral valve regurgitation.  6. The aortic valve is normal in structure. Aortic valve regurgitation is mild.  7. The inferior vena cava is dilated in size with <50% respiratory variability, suggesting right atrial pressure of 15 mmHg. FINDINGS  Left Ventricle: Left ventricular ejection fraction, by estimation, is 65 to 70%. The left ventricle has normal function. The left ventricular internal cavity size was normal in size. There is moderate concentric left ventricular hypertrophy. Left ventricular diastolic parameters are consistent with Grade I diastolic dysfunction (impaired relaxation). Elevated left atrial pressure. Right Ventricle: The right ventricular size is mildly enlarged. Right vetricular wall thickness  was not assessed. Right ventricular systolic function is low normal. Left Atrium: Left atrial size was normal in size. Right Atrium: Right atrial size was mild to moderately dilated. Pericardium: A small pericardial effusion is present. Mitral Valve: The mitral valve is normal in structure. Trivial mitral valve regurgitation. Tricuspid Valve: The tricuspid valve is normal in structure. Tricuspid valve regurgitation is mild. Aortic Valve: The aortic valve is normal in structure. Aortic valve regurgitation is mild. Pulmonic Valve: The pulmonic valve was not well visualized. Pulmonic valve regurgitation is trivial. Aorta: The aortic root and ascending aorta are structurally normal, with no evidence of dilitation. Venous: The inferior vena cava is dilated in size with less than 50% respiratory variability, suggesting right atrial pressure of 15 mmHg. IAS/Shunts: No atrial level shunt detected by color flow Doppler. LEFT VENTRICLE PLAX 2D LVIDd:         4.30 cm Diastology LVIDs:         2.70 cm LV e' medial:    3.37 cm/s LV PW:         1.40 cm LV E/e' medial:  33.8 LV IVS:        1.50 cm LV e' lateral:   4.57 cm/s                        LV E/e' lateral: 24.9  RIGHT VENTRICLE            IVC RV Basal diam:  4.10 cm    IVC diam: 2.30 cm RV Mid diam:    3.90 cm RV S prime:     8.92 cm/s TAPSE (M-mode): 1.4 cm LEFT ATRIUM             Index        RIGHT ATRIUM           Index LA diam:        3.70 cm 1.58 cm/m   RA Area:     26.60 cm LA Vol (A2C):   54.0 ml 23.06 ml/m  RA Volume:   88.20 ml  37.66 ml/m LA Vol (A4C):   70.7 ml 30.19  ml/m LA Biplane Vol: 66.6 ml 28.44 ml/m  AORTIC VALVE LVOT Vmax:   127.00 cm/s LVOT Vmean:  77.500 cm/s LVOT VTI:    0.239 m  AORTA Ao Root diam: 3.40 cm MITRAL VALVE                TRICUSPID VALVE MV Area (PHT): 3.27 cm     TR Peak grad:   39.4 mmHg MV Decel Time: 232 msec     TR Vmax:        314.00 cm/s MV E velocity: 114.00 cm/s MV A velocity: 111.00 cm/s  SHUNTS MV E/A ratio:  1.03          Systemic VTI: 0.24 m Dietrich Pates MD Electronically signed by Dietrich Pates MD Signature Date/Time: 04/17/2021/4:03:48 PM    Final      Discharge Exam: Vitals:   04/21/21 0841 04/21/21 1146  BP: (!) 157/86 (!) 157/87  Pulse: 86 86  Resp: 18 20  Temp:  99.2 F (37.3 C)  SpO2: 95% 92%   Vitals:   04/20/21 2110 04/21/21 0340 04/21/21 0841 04/21/21 1146  BP: (!) 157/83 (!) 164/85 (!) 157/86 (!) 157/87  Pulse: 85 81 86 86  Resp: 20 20 18 20   Temp: 98.2 F (36.8 C) 98 F (36.7 C)  99.2 F (37.3 C)  TempSrc: Oral Oral  Oral  SpO2: 94% 99% 95% 92%  Weight:  117.4 kg    Height:        General: Pt is alert, awake, not in acute distress Cardiovascular: RRR, S1/S2 +, no rubs, no gallops Respiratory: CTA bilaterally, no wheezing, no rhonchi Abdominal: Soft, NT, ND, bowel sounds + Extremities: no edema, no cyanosis    The results of significant diagnostics from this hospitalization (including imaging, microbiology, ancillary and laboratory) are listed below for reference.     Microbiology: Recent Results (from the past 240 hour(s))  Blood culture (routine x 2)     Status: None   Collection Time: 04/16/21 10:30 PM   Specimen: Right Antecubital; Blood  Result Value Ref Range Status   Specimen Description   Final    RIGHT ANTECUBITAL BOTTLES DRAWN AEROBIC AND ANAEROBIC   Special Requests Blood Culture adequate volume  Final   Culture   Final    NO GROWTH 5 DAYS Performed at [facility], [address] [place], [place]    Report Status 04/21/2021 FINAL  Final  Resp Panel by RT-PCR (Flu A&B, Covid) Nasopharyngeal Swab     Status: Abnormal   Collection Time: 04/16/21 10:49 PM   Specimen: Nasopharyngeal Swab; Nasopharyngeal(NP) swabs in vial transport medium  Result Value Ref Range Status   SARS Coronavirus 2 by RT PCR NEGATIVE NEGATIVE Final    Comment: (NOTE) SARS-CoV-2 target nucleic acids are NOT DETECTED.  The SARS-CoV-2 RNA is generally detectable in upper  respiratory specimens during the acute phase of infection. The lowest concentration of SARS-CoV-2 viral copies this assay can detect is 138 copies/mL. A negative result does not preclude SARS-Cov-2 infection and should not be used as the sole basis for treatment or other patient management decisions. A negative result may occur with  improper specimen collection/handling, submission of specimen other than nasopharyngeal swab, presence of viral mutation(s) within the areas targeted by this assay, and inadequate number of viral copies(<138 copies/mL). A negative result must be combined with clinical observations, patient history, and epidemiological information. The expected result is Negative.  Fact Sheet for Patients:  04/18/21  Fact Sheet for  Healthcare Providers:  IncredibleEmployment.be  This test is no t yet approved or cleared by the [place] and  has been authorized for detection and/or diagnosis of SARS-CoV-2 by FDA under an Emergency Use Authorization (EUA). This EUA will remain  in effect (meaning this test can be used) for the duration of the COVID-19 declaration under Section 564(b)(1) of the Act, 21 U.S.C.section 360bbb-3(b)(1), unless the authorization is terminated  or revoked sooner.       Influenza A by PCR POSITIVE (A) NEGATIVE Final   Influenza B by PCR NEGATIVE NEGATIVE Final    Comment: (NOTE) The Xpert Xpress SARS-CoV-2/FLU/RSV plus assay is intended as an aid in the diagnosis of influenza from Nasopharyngeal swab specimens and should not be used as a sole basis for treatment. Nasal washings and aspirates are unacceptable for Xpert Xpress SARS-CoV-2/FLU/RSV testing.  Fact Sheet for Patients: EntrepreneurPulse.com.au  Fact Sheet for Healthcare Providers: IncredibleEmployment.be  This test is not yet approved or cleared by the [place] FDA and has been  authorized for detection and/or diagnosis of SARS-CoV-2 by FDA under an Emergency Use Authorization (EUA). This EUA will remain in effect (meaning this test can be used) for the duration of the COVID-19 declaration under Section 564(b)(1) of the Act, 21 U.S.C. section 360bbb-3(b)(1), unless the authorization is terminated or revoked.  Performed at [facility], [address], [place], [place] [address]   Blood culture (routine x 2)     Status: None   Collection Time: 04/16/21 11:29 PM   Specimen: BLOOD LEFT HAND  Result Value Ref Range Status   Specimen Description   Final    BLOOD LEFT HAND BOTTLES DRAWN AEROBIC AND ANAEROBIC   Special Requests   Final    Blood Culture results may not be optimal due to an inadequate volume of blood received in culture bottles   Culture   Final    NO GROWTH 5 DAYS Performed at [facility], [address] [place], [place] [address]    Report Status 04/21/2021 FINAL  Final     Labs: BNP (last 3 results) Recent Labs    04/16/21 2226 04/17/21 1453  BNP 1,519.0* [id number]*   Basic Metabolic Panel: Recent Labs  Lab 04/17/21 1011 04/18/21 0149 04/18/21 0149 04/18/21 0725 04/18/21 1628 04/19/21 0428 04/20/21 0246 04/21/21 0311  NA 140 140  --   --   --  142 140 142  K 3.1* 3.9  --   --   --  3.7 4.0 4.0  CL 99 101  --   --   --  100 101 100  CO2 26 27  --   --   --  30 28 34*  GLUCOSE 127* 115*  --   --   --  91 96 101*  BUN 42* 40*  --   --   --  38* 32* 23  CREATININE 2.46* 2.32*  --   --   --  1.86* 1.58* 1.40*  CALCIUM 8.4* 8.5*  --   --   --  8.3* 8.4* 8.7*  MG  --  1.8   < > 1.9 1.8 1.7 2.1 1.7  PHOS  --   --   --  4.6 3.3 4.2  --   --    < > = values in this interval not displayed.   Liver Function Tests: No results for input(s): AST, ALT, ALKPHOS, BILITOT, PROT, ALBUMIN in the last 168 hours. No results for input(s): LIPASE, AMYLASE in the  last 168 hours. Recent Labs  Lab 04/17/21 0109  AMMONIA 26   CBC: Recent Labs  Lab  04/16/21 2226 04/17/21 1011 04/18/21 0149 04/19/21 0428 04/20/21 0246 04/21/21 0311  WBC 7.3 7.0 11.7* 9.0 6.3 6.6  NEUTROABS 5.7 5.9  --   --   --   --   HGB 10.3* 11.4* 10.8* 12.0 11.6* 11.3*  HCT 35.3* 37.8 35.7* 40.9 40.3 39.3  MCV 89.8 84.8 84.2 86.1 88.2 86.6  PLT 266 278 284 260 234 208   Cardiac Enzymes: No results for input(s): CKTOTAL, CKMB, CKMBINDEX, TROPONINI in the last 168 hours. BNP: Invalid input(s): POCBNP CBG: Recent Labs  Lab 04/19/21 2019 04/20/21 0009 04/20/21 0405 04/20/21 0829 04/20/21 1058  GLUCAP 121* 119* 117* 182* 125*   D-Dimer No results for input(s): DDIMER in the last 72 hours. Hgb A1c No results for input(s): HGBA1C in the last 72 hours. Lipid Profile Recent Labs    04/20/21 0246  CHOL 114  HDL 32*  LDLCALC 62  TRIG 845  CHOLHDL 3.6   Thyroid function studies No results for input(s): TSH, T4TOTAL, T3FREE, THYROIDAB in the last 72 hours.  Invalid input(s): FREET3 Anemia work up No results for input(s): VITAMINB12, FOLATE, FERRITIN, TIBC, IRON, RETICCTPCT in the last 72 hours. Urinalysis No results found for: COLORURINE, APPEARANCEUR, LABSPEC, PHURINE, GLUCOSEU, HGBUR, BILIRUBINUR, KETONESUR, PROTEINUR, UROBILINOGEN, NITRITE, LEUKOCYTESUR Sepsis Labs Invalid input(s): PROCALCITONIN,  WBC,  LACTICIDVEN Microbiology Recent Results (from the past 240 hour(s))  Blood culture (routine x 2)     Status: None   Collection Time: 04/16/21 10:30 PM   Specimen: Right Antecubital; Blood  Result Value Ref Range Status   Specimen Description   Final    RIGHT ANTECUBITAL BOTTLES DRAWN AEROBIC AND ANAEROBIC   Special Requests Blood Culture adequate volume  Final   Culture   Final    NO GROWTH 5 DAYS Performed at [facility], [address], [place], [place] [address]    Report Status 04/21/2021 FINAL  Final  Resp Panel by RT-PCR (Flu A&B, Covid) Nasopharyngeal Swab     Status: Abnormal   Collection Time: 04/16/21 10:49 PM   Specimen:  Nasopharyngeal Swab; Nasopharyngeal(NP) swabs in vial transport medium  Result Value Ref Range Status   SARS Coronavirus 2 by RT PCR NEGATIVE NEGATIVE Final    Comment: (NOTE) SARS-CoV-2 target nucleic acids are NOT DETECTED.  The SARS-CoV-2 RNA is generally detectable in upper respiratory specimens during the acute phase of infection. The lowest concentration of SARS-CoV-2 viral copies this assay can detect is 138 copies/mL. A negative result does not preclude SARS-Cov-2 infection and should not be used as the sole basis for treatment or other patient management decisions. A negative result may occur with  improper specimen collection/handling, submission of specimen other than nasopharyngeal swab, presence of viral mutation(s) within the areas targeted by this assay, and inadequate number of viral copies(<138 copies/mL). A negative result must be combined with clinical observations, patient history, and epidemiological information. The expected result is Negative.  Fact Sheet for Patients:  BloggerCourse.com  Fact Sheet for Healthcare Providers:  SeriousBroker.it  This test is no t yet approved or cleared by the [place] FDA and  has been authorized for detection and/or diagnosis of SARS-CoV-2 by FDA under an Emergency Use Authorization (EUA). This EUA will remain  in effect (meaning this test can be used) for the duration of the COVID-19 declaration under Section 564(b)(1) of the Act, 21 U.S.C.section 360bbb-3(b)(1), unless the authorization is  terminated  or revoked sooner.       Influenza A by PCR POSITIVE (A) NEGATIVE Final   Influenza B by PCR NEGATIVE NEGATIVE Final    Comment: (NOTE) The Xpert Xpress SARS-CoV-2/FLU/RSV plus assay is intended as an aid in the diagnosis of influenza from Nasopharyngeal swab specimens and should not be used as a sole basis for treatment. Nasal washings and aspirates are unacceptable  for Xpert Xpress SARS-CoV-2/FLU/RSV testing.  Fact Sheet for Patients: EntrepreneurPulse.com.au  Fact Sheet for Healthcare Providers: IncredibleEmployment.be  This test is not yet approved or cleared by the [place] FDA and has been authorized for detection and/or diagnosis of SARS-CoV-2 by FDA under an Emergency Use Authorization (EUA). This EUA will remain in effect (meaning this test can be used) for the duration of the COVID-19 declaration under Section 564(b)(1) of the Act, 21 U.S.C. section 360bbb-3(b)(1), unless the authorization is terminated or revoked.  Performed at [facility], [address], [place], [place] [address]   Blood culture (routine x 2)     Status: None   Collection Time: 04/16/21 11:29 PM   Specimen: BLOOD LEFT HAND  Result Value Ref Range Status   Specimen Description   Final    BLOOD LEFT HAND BOTTLES DRAWN AEROBIC AND ANAEROBIC   Special Requests   Final    Blood Culture results may not be optimal due to an inadequate volume of blood received in culture bottles   Culture   Final    NO GROWTH 5 DAYS Performed at [facility], [address], [place],  [address]    Report Status 04/21/2021 FINAL  Final     Time coordinating discharge: Over 30 minutes  SIGNED:   Darliss Cheney, MD  [facility] 04/21/2021, 1:53 PM  If 7PM-7AM, please contact night-coverage [email or web address]

## 2021-09-27 DEATH — deceased

## 2023-07-03 IMAGING — CT CT HEAD W/O CM
3 series · 15 of 47 positions shown, 18 images · non-contrast
Comparison: None.

CLINICAL DATA: Delirium

EXAM:
CT HEAD WITHOUT CONTRAST
TECHNIQUE: Contiguous axial images were obtained from the base of the skull
through the vertex without intravenous contrast.

[Series 2: head w o · axial · 0.51mm/px · z∈[-13,+137]mm · 9 of 36 slices shown, 12 images]
[im 3/36  brain]
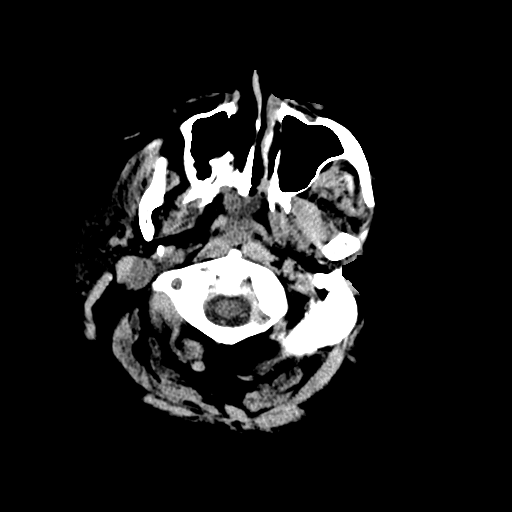
[im 3/36  bone]
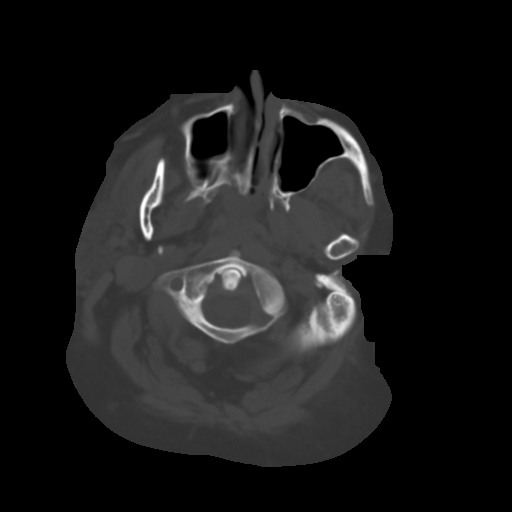
[im 7/36  brain]
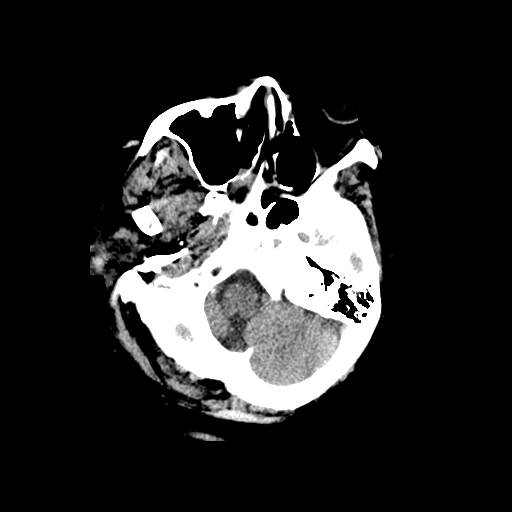
[im 10/36  brain]
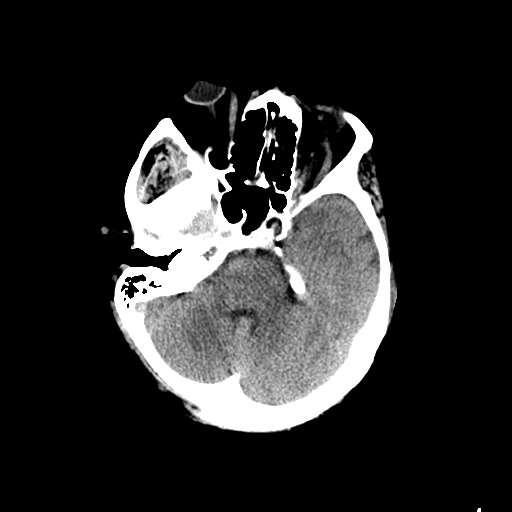
[im 14/36  brain]
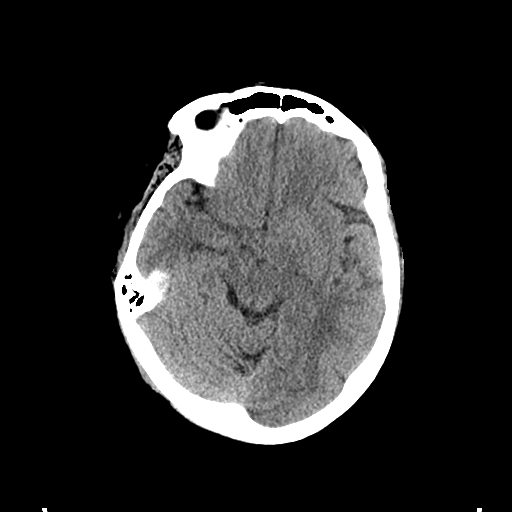
[im 19/36  brain]
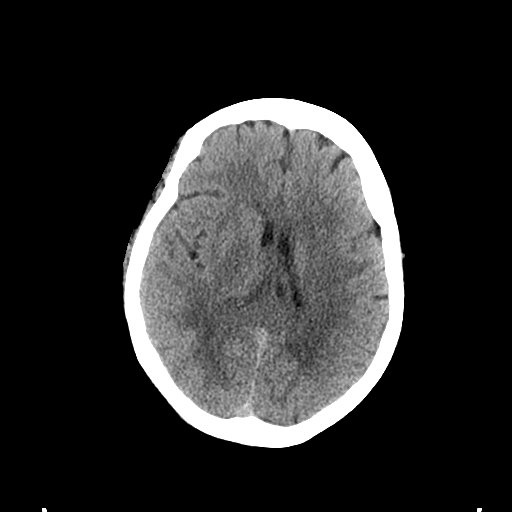
[im 19/36  bone]
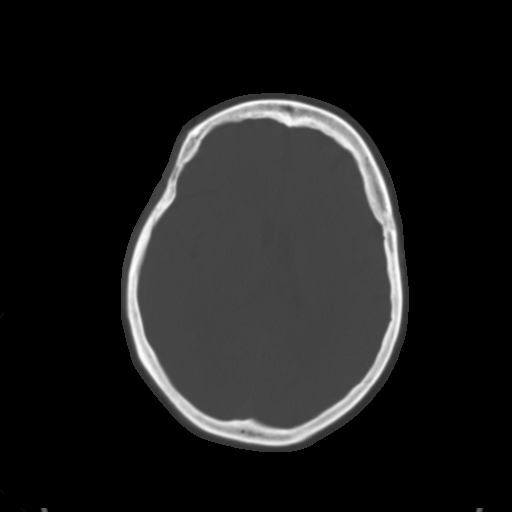
[im 22/36  brain]
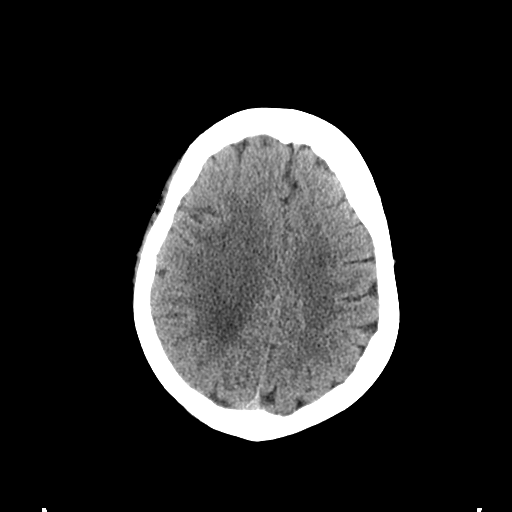
[im 26/36  brain]
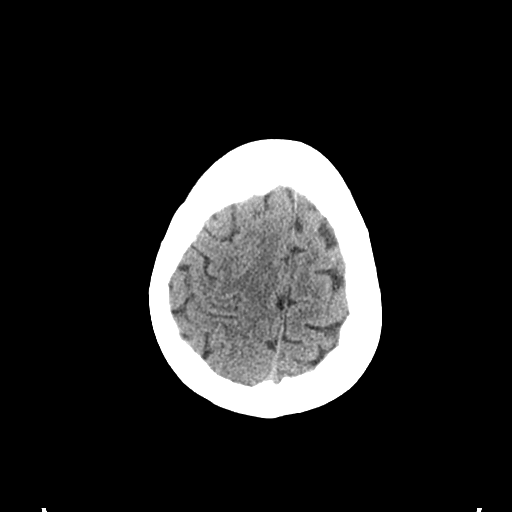
[im 29/36  brain]
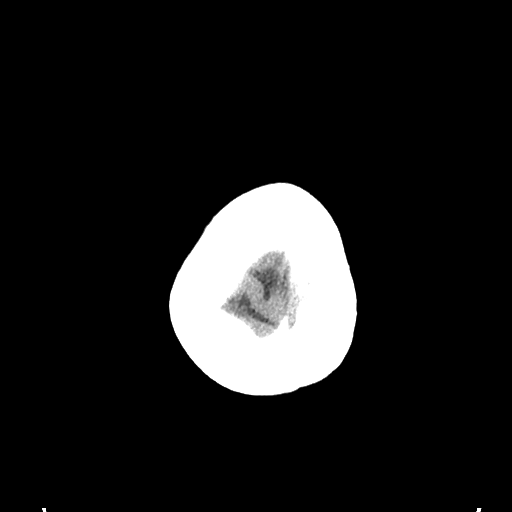
[im 33/36  brain]
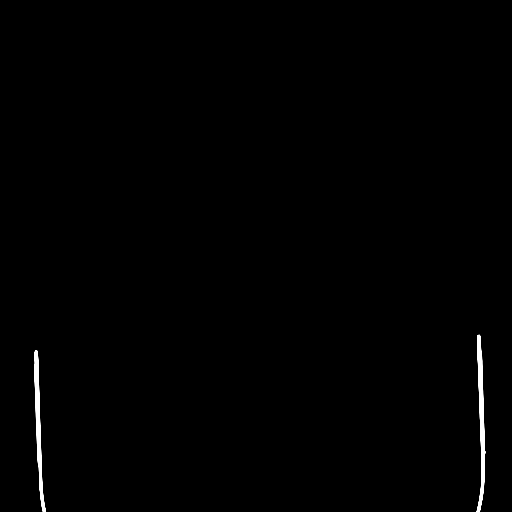
[im 33/36  bone]
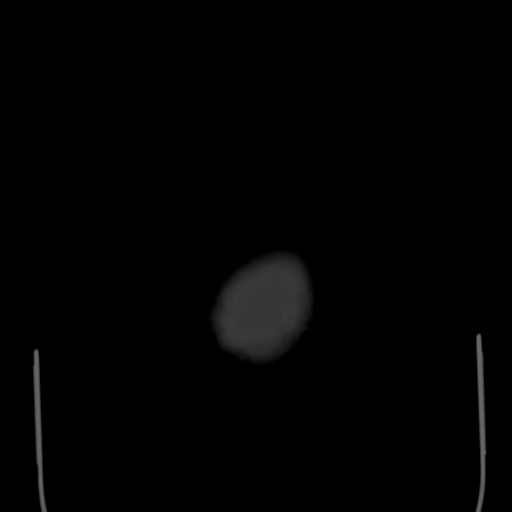

[Series 4: coronal soft · coronal · 0.36mm/px · 3 of 76 slices shown]
[im 26/76  brain]
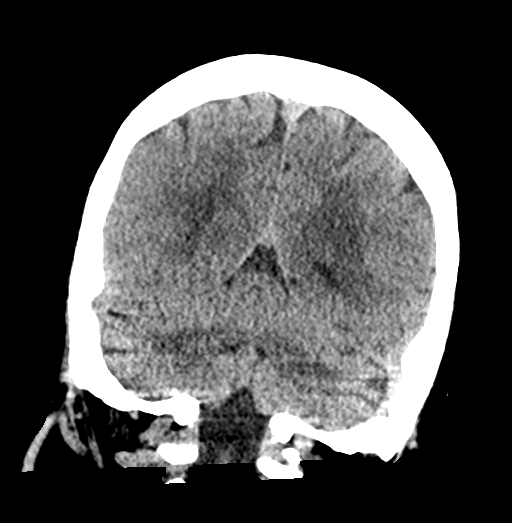
[im 34/76  brain]
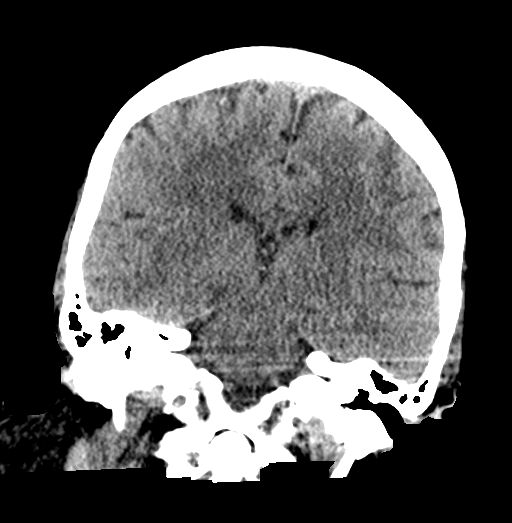
[im 42/76  brain]
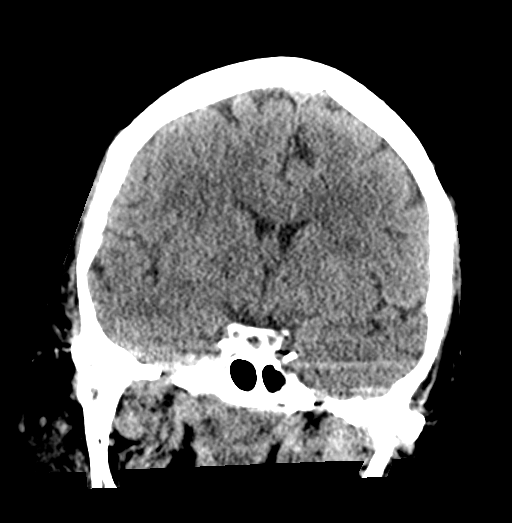

[Series 5: sagittal soft · sagittal · 0.36mm/px · 3 of 68 slices shown]
[im 23/68  brain]
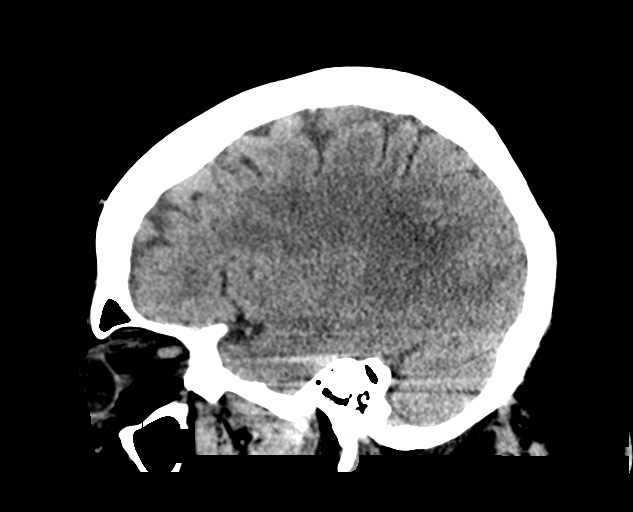
[im 34/68  brain]
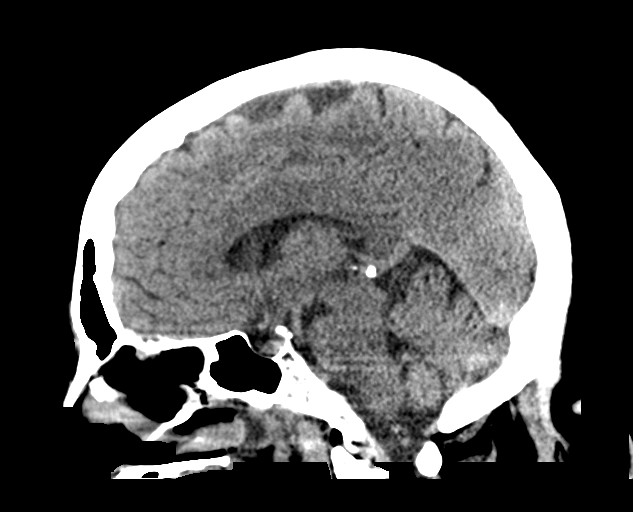
[im 45/68  brain]
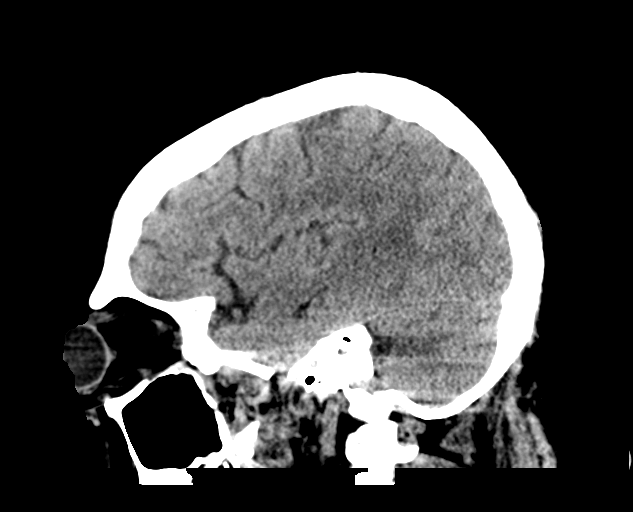

[15 of 47 positions shown; findings below may reference images not displayed]

FINDINGS: Brain: Normal anatomic configuration. Parenchymal volume loss is
commensurate with the patient's age. Moderate periventricular white
matter changes are present likely reflecting the sequela of small
vessel ischemia. No abnormal intra or extra-axial mass lesion or
fluid collection. No abnormal mass effect or midline shift. No
evidence of acute intracranial hemorrhage or infarct. Ventricular
size is normal. Cerebellum unremarkable.

Vascular: No asymmetric hyperdense vasculature at the skull base.

Skull: Intact

Sinuses/Orbits: There is bilateral exophthalmos with hypertrophy
extraocular musculature as well as the retro-orbital fat in keeping
with Gvozdev ophthalmopathy. The paranasal sinuses are clear.

Other: Mastoid air cells and middle ear cavities are clear.
IMPRESSION: Bilateral exophthalmos with hypertrophy of the extraocular
musculature and retro-orbital fat in keeping with Gvozdev
ophthalmopathy. Correlation with thyroid function tests is
recommended.

Moderate periventricular white matter changes likely reflecting the
sequela of small vessel ischemia, slightly advanced in relation of
the patient's age.

No acute intracranial abnormality.

## 2023-07-04 IMAGING — CT CT ANGIO CHEST
2 of 6 series · 16 of 46 positions shown · IV contrast (Omni 300)
Comparison: No prior CT

CLINICAL DATA: 59-year-old female with a history of nontraumatic
aortic disease

EXAM:
CT ANGIOGRAPHY CHEST WITH CONTRAST
TECHNIQUE: Multidetector CT imaging of the chest was performed using the
standard protocol during bolus administration of intravenous
contrast. Multiplanar CT image reconstructions and MIPs were
obtained to evaluate the vascular anatomy.
CONTRAST:  100mL OMNIPAQUE IOHEXOL 350 MG/ML SOLN

[Series 5: thoracic cta 2mm · axial · 0.80mm/px · z∈[-978,-648]mm · 13 of 188 slices shown]
[im 12/188  lung]
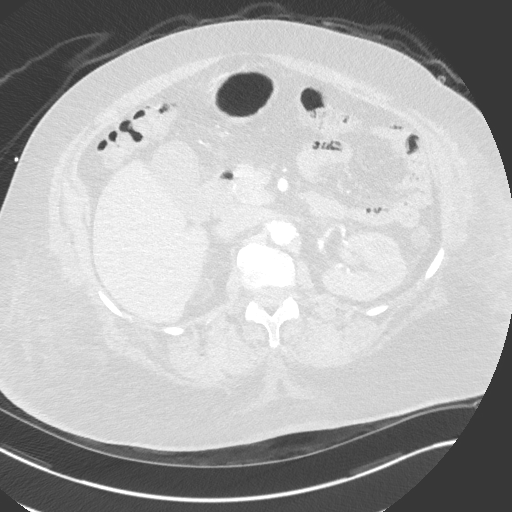
[im 23/188  soft-tissue]
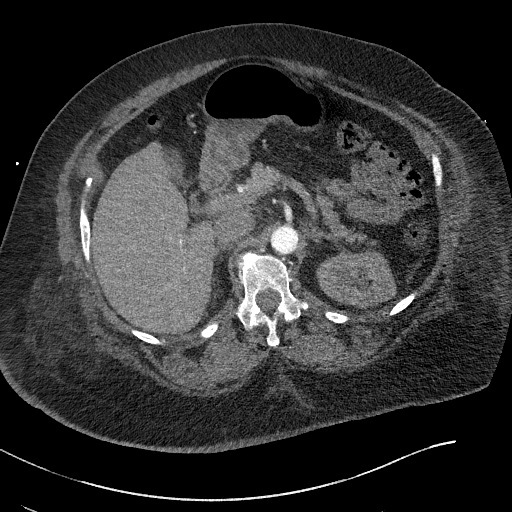
[im 45/188  lung]
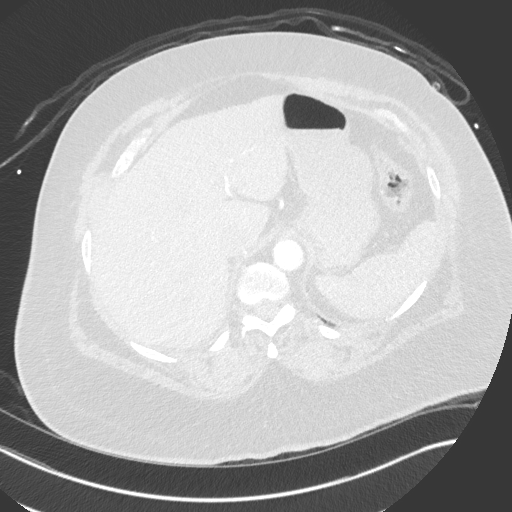
[im 56/188  soft-tissue]
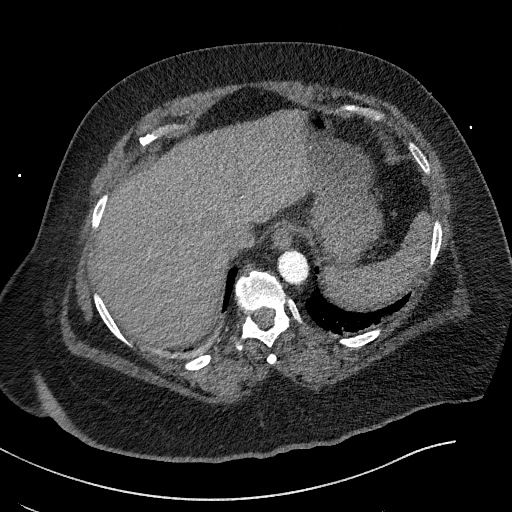
[im 67/188  lung]
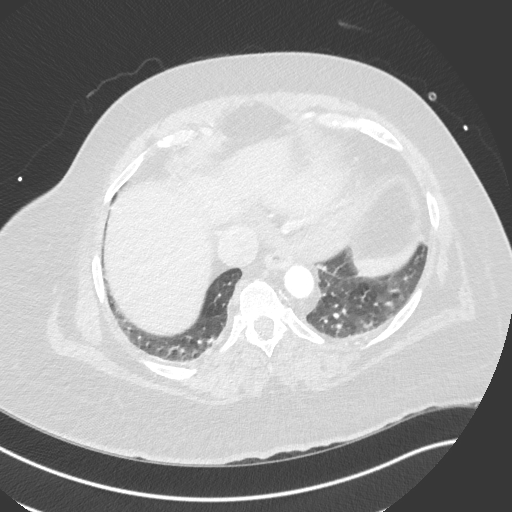
[im 78/188  soft-tissue]
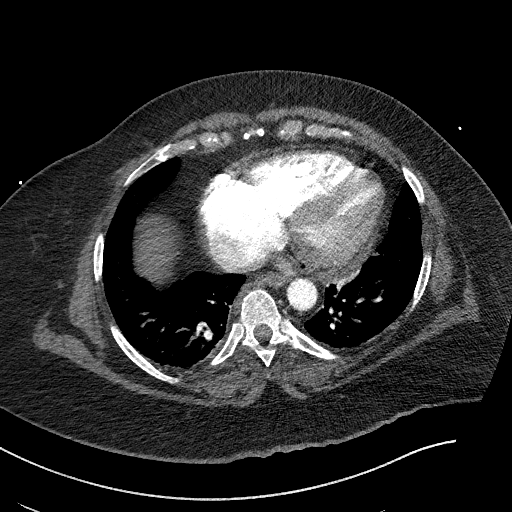
[im 100/188  lung]
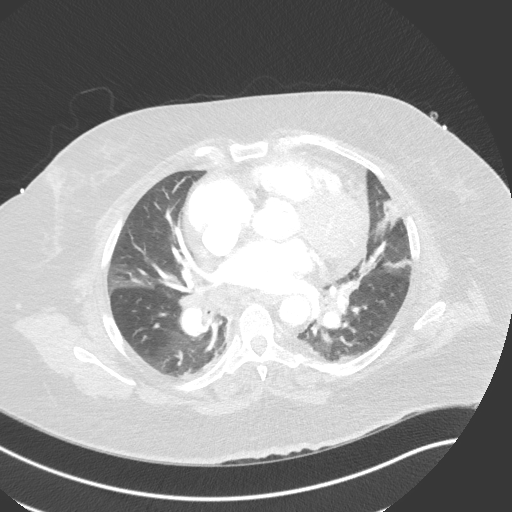
[im 111/188  soft-tissue]
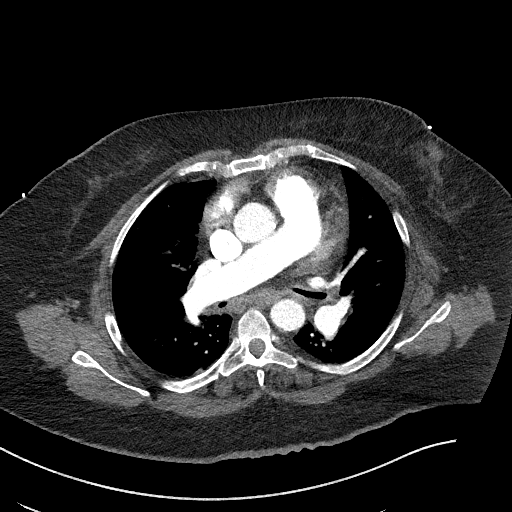
[im 122/188  lung]
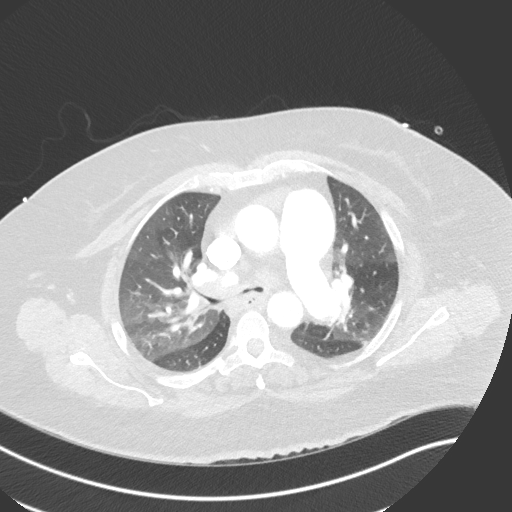
[im 133/188  soft-tissue]
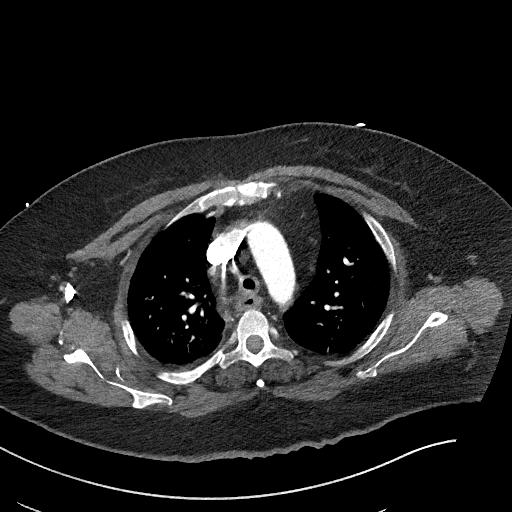
[im 144/188  lung]
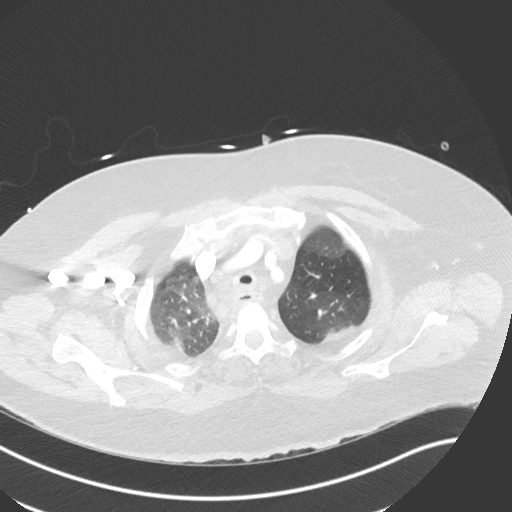
[im 166/188  soft-tissue]
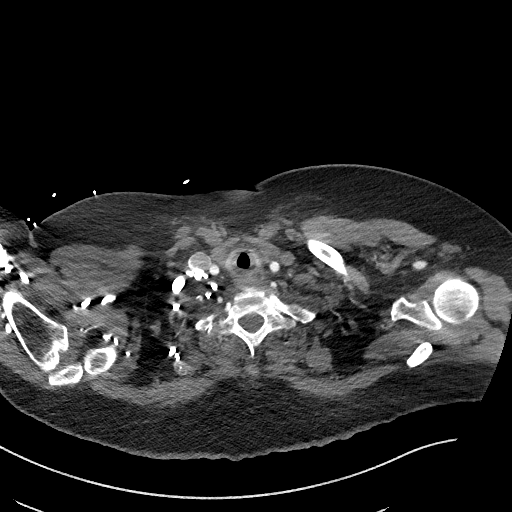
[im 177/188  lung]
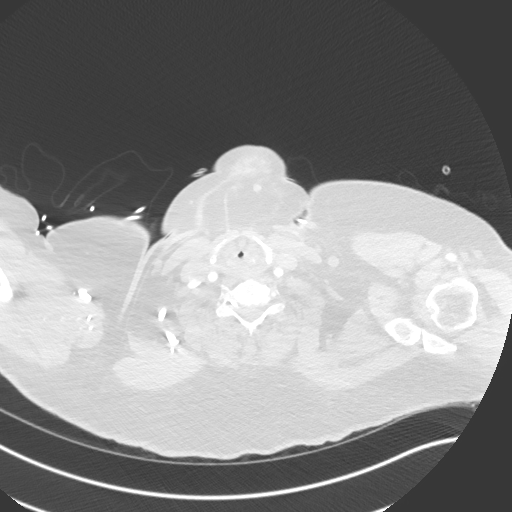

[Series 8: thoracic cta 2mm cor · coronal · 0.62mm/px · 3 of 151 slices shown]
[im 38/151  soft-tissue]
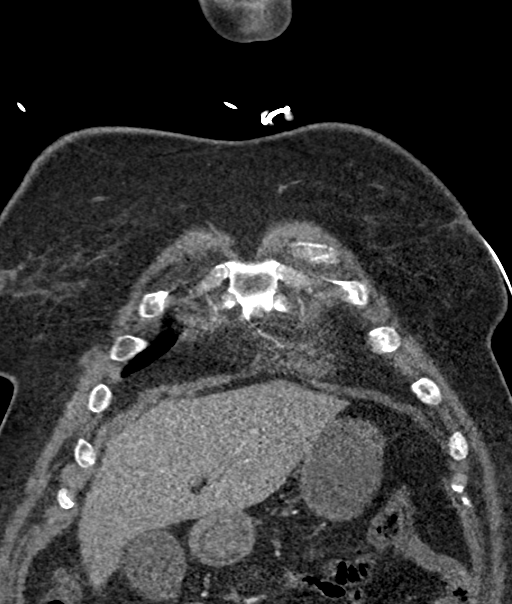
[im 76/151  soft-tissue]
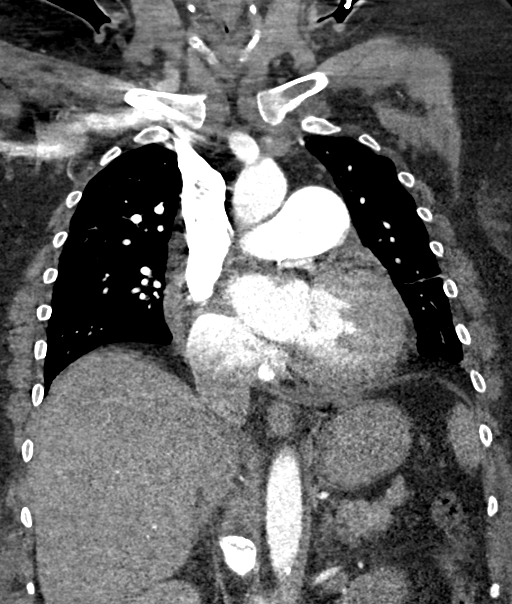
[im 113/151  soft-tissue]
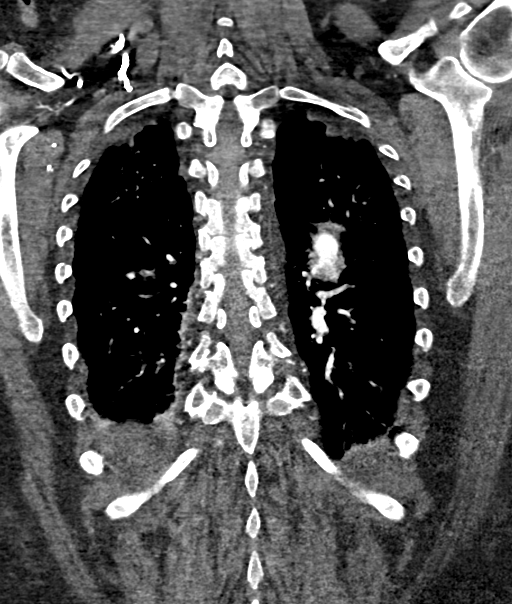

[16 of 46 positions shown; findings below may reference images not displayed]

FINDINGS: Cardiovascular:

Heart:

Cardiomegaly. Trace pericardial fluid/thickening. Coronary
calcifications of left main, left anterior descending, right
coronary arteries.

Aorta:

Minimal aortic valve calcifications.

Greatest estimated diameter of the aortic annulus 24 mm on the
coronal reformatted images.

Greatest estimated diameter of the Tiger junction, 27 mm on
the coronal images.

Greatest estimated diameter of the ascending aorta on the axial
images, 31 mm.

Mild atherosclerotic changes of the aorta. Branch vessels are patent
with a 3 vessel arch. Cervical cerebral vessels patent at the base
of the neck.

No pedunculated plaque, ulcerated plaque, dissection, periaortic
fluid. No wall thickening.

Pulmonary arteries:

The correlate for findings on prior plain film of the enlarged left
heart border is the main pulmonary artery diameter which is
enlarged. Diameter of the main pulmonary artery estimated 4.5 cm.
The timing of the contrast bolus is not optimized for the most
sensitive evaluation of filling defects, however, there are no
proximal filling defects identified. The pulmonary arteries are
somewhat enlarged at the periphery of the lungs compared to the
accompanying bronchi.

Mediastinum/Nodes: Multiple mediastinal lymph nodes. Lymph nodes
throughout all nodal station, borderline enlarged. Right hilar lymph
nodes and subcarinal lymph nodes borderline enlarged. Unremarkable
appearance of the thoracic esophagus.

Unremarkable appearance of the thoracic inlet.

Diffuse tracheal wall thickening without focal debris.

Lungs/Pleura: Geographic ground-glass opacification of the upper
lungs and lower lungs. No pneumothorax or pleural effusion. In
addition diffuse tracheal wall thickening there is diffuse
thickening of the left and right mainstem bronchi with associated
relative narrowing. The narrowing continues into the segmental
bronchi of the bilateral lungs, including upper and lower lobes,
with some associated partial obstructions.

No interlobular septal thickening or thickening of the fissures.

Upper Abdomen: No acute finding of the upper abdomen.

Musculoskeletal: No acute displaced fracture. Degenerative changes
of the spine.

Review of the MIP images confirms the above findings.
IMPRESSION: CT angiogram negative for acute arterial abnormality, and negative
for aortic aneurysm.

The correlate of the prior plain film is enlargement of the main
pulmonary artery, which measures 4.5 cm. Additionally there is
relative enlargement of the pulmonary arteries towards the periphery
of the lungs, suggesting pulmonary artery hypertension.

Diffuse tracheal wall thickening and associated thickening of the
mainstem bronchi bilaterally and proximal segmental bronchi
bilaterally. Favored etiology is infection/inflammatory such as
bronchitis, however, diffuse tracheitis can be caused by atypical
infection including tuberculous. Additional considerations though
less likely include sarcoid, polychondritis, or amyloidosis.

Inflammatory changes of the mainstem of the trachea and the
bilateral segmental bronchi result in airway narrowing and multiple
occlusions, manifested as airway trapping/geographic ground-glass of
the bilateral lower lobes.

Multiple mediastinal lymph nodes are presumably reactive, though
could be seen with granulomatous disease such as sarcoid.

Aortic atherosclerosis with coronary artery disease. Aortic
Atherosclerosis ([id number]).
